# Patient Record
Sex: Male | Born: 1970
Health system: Southern US, Community
[De-identification: ages and names within clinical notes are randomized; demographics above are authoritative.]

## PROBLEM LIST (undated history)

## (undated) VITALS — BP 111/71 | HR 87 | Temp 98.6°F | Resp 16

## (undated) DIAGNOSIS — I10 Essential (primary) hypertension: Secondary | ICD-10-CM

## (undated) DIAGNOSIS — I82409 Acute embolism and thrombosis of unspecified deep veins of unspecified lower extremity: Secondary | ICD-10-CM

## (undated) DIAGNOSIS — F319 Bipolar disorder, unspecified: Secondary | ICD-10-CM

## (undated) DIAGNOSIS — I2699 Other pulmonary embolism without acute cor pulmonale: Secondary | ICD-10-CM

## (undated) DIAGNOSIS — F209 Schizophrenia, unspecified: Secondary | ICD-10-CM

## (undated) DIAGNOSIS — J449 Chronic obstructive pulmonary disease, unspecified: Secondary | ICD-10-CM

## (undated) DIAGNOSIS — F329 Major depressive disorder, single episode, unspecified: Secondary | ICD-10-CM

## (undated) DIAGNOSIS — F32A Depression, unspecified: Secondary | ICD-10-CM

## (undated) DIAGNOSIS — F419 Anxiety disorder, unspecified: Secondary | ICD-10-CM

## (undated) DIAGNOSIS — F4310 Post-traumatic stress disorder, unspecified: Secondary | ICD-10-CM

## (undated) HISTORY — PX: LEG AMPUTATION: SHX1105

## (undated) HISTORY — DX: Chronic obstructive pulmonary disease, unspecified: J44.9

## (undated) HISTORY — PX: CLAVICLE SURGERY: SHX598

## (undated) HISTORY — DX: Acute embolism and thrombosis of unspecified deep veins of unspecified lower extremity: I82.409

---
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
## 2017-12-10 ENCOUNTER — Emergency Department: Payer: Self-pay

## 2017-12-10 ENCOUNTER — Encounter: Payer: Self-pay | Admitting: Emergency Medicine

## 2017-12-10 ENCOUNTER — Other Ambulatory Visit: Payer: Self-pay

## 2017-12-10 ENCOUNTER — Emergency Department
Admission: EM | Admit: 2017-12-10 | Discharge: 2017-12-10 | Disposition: A | Discharge status: Home / Self Care | Payer: Self-pay | Attending: Emergency Medicine | Admitting: Emergency Medicine

## 2017-12-10 DIAGNOSIS — S7001XA Contusion of right hip, initial encounter: Secondary | ICD-10-CM | POA: Insufficient documentation

## 2017-12-10 DIAGNOSIS — Y999 Unspecified external cause status: Secondary | ICD-10-CM | POA: Insufficient documentation

## 2017-12-10 DIAGNOSIS — R202 Paresthesia of skin: Secondary | ICD-10-CM | POA: Insufficient documentation

## 2017-12-10 DIAGNOSIS — Y939 Activity, unspecified: Secondary | ICD-10-CM | POA: Insufficient documentation

## 2017-12-10 DIAGNOSIS — Y929 Unspecified place or not applicable: Secondary | ICD-10-CM | POA: Insufficient documentation

## 2017-12-10 DIAGNOSIS — M5416 Radiculopathy, lumbar region: Secondary | ICD-10-CM | POA: Insufficient documentation

## 2017-12-10 DIAGNOSIS — W19XXXA Unspecified fall, initial encounter: Secondary | ICD-10-CM | POA: Insufficient documentation

## 2017-12-10 DIAGNOSIS — F17210 Nicotine dependence, cigarettes, uncomplicated: Secondary | ICD-10-CM | POA: Insufficient documentation

## 2017-12-10 DIAGNOSIS — R200 Anesthesia of skin: Secondary | ICD-10-CM | POA: Insufficient documentation

## 2017-12-10 MED ORDER — HYDROCODONE-ACETAMINOPHEN 5-325 MG PO TABS: 1.0000 | ORAL_TABLET | Freq: Four times a day (QID) | ORAL | 0 refills | Status: DC | PRN

## 2017-12-10 MED ORDER — PREDNISONE 10 MG PO TABS: 10.0000 mg | ORAL_TABLET | Freq: Every day | ORAL | 0 refills | Status: DC

## 2017-12-10 MED ORDER — HYDROCODONE-ACETAMINOPHEN 5-325 MG PO TABS
1.0000 | ORAL_TABLET | ORAL | Status: AC
  Administered 2017-12-10: 1 via ORAL
  Filled 2017-12-10: qty 1

## 2017-12-10 NOTE — ED Notes (Signed)
Pt states he is here for leg pain. Pt arrives in wheelchair, appears in no acute distress.

## 2017-12-10 NOTE — Discharge Instructions (Signed)
Please take medications as prescribed.  Follow-up with orthopedics if no improvement in 1 week.  Return to the emergency department for any worsening symptoms or urgent changes in your health.

## 2017-12-10 NOTE — ED Provider Notes (Signed)
[facility] EMERGENCY DEPARTMENT Provider Note   CSN: [id number] Arrival date & time: 12/10/17  1940     History   Chief Complaint Chief Complaint  Patient presents with  . Fall    HPI Michael Farrell is a 46 y.o. male presents to the emergency department for evaluation of right hip and right lower leg pain.  Patient states he fell a couple of days ago onto his buttocks.  Was able to go to work today but noticed increased pain in his right hip, right anterior thigh and into his right lower leg.  He describes some numbness and tingling in his right anterior shin and foot.  No loss of bowel or bladder symptoms.  Pain is severe with walking.  Initially after the fall 2 days ago he was able to ambulate.  He has not been taking any medications for pain.  He describes a history of sciatica.  He denies any left leg pain.  He denies any bilateral lower extremity swelling.  No chest pain, shortness of breath.  No head injury, headache, neck pain.  HPI  History reviewed. No pertinent past medical history.  There are no active problems to display for this patient.   Past Surgical History:  Procedure Laterality Date  . CLAVICLE SURGERY     pt unsure whether it was left or right; has been broken "a couple times"       Home Medications    Prior to Admission medications   Medication Sig Start Date End Date Taking? Authorizing Provider  HYDROcodone-acetaminophen (NORCO) 5-325 MG tablet Take 1 tablet by mouth every 6 (six) hours as needed for moderate pain. 12/10/17   Evon Slack, PA-C  predniSONE (DELTASONE) 10 MG tablet Take 1 tablet (10 mg total) by mouth daily. 6,5,4,3,2,1 six day taper 12/10/17   Evon Slack, PA-C    Family History No family history on file.  Social History Social History   Tobacco Use  . Smoking status: Current Every Day Smoker    Packs/day: 1.00    Types: Cigarettes  . Smokeless tobacco: Former Engineer, water Use Topics  .  Alcohol use: Yes    Alcohol/week: 8.4 oz    Types: 14 Cans of beer per week  . Drug use: No     Allergies   Patient has no allergy information on record.   Review of Systems Review of Systems  Constitutional: Negative for fever.  Respiratory: Negative for shortness of breath.   Cardiovascular: Negative for chest pain.  Gastrointestinal: Negative for abdominal pain.  Genitourinary: Negative for difficulty urinating, dysuria and urgency.  Musculoskeletal: Positive for gait problem. Negative for back pain and myalgias.  Skin: Negative for rash.  Neurological: Positive for numbness. Negative for dizziness and headaches.     Physical Exam Updated Vital Signs BP 128/86 (BP Location: Right Arm)   Pulse (!) 123   Temp 98.4 F (36.9 C) (Oral)   Resp 18   Ht 5\' 10"  (1.778 m)   Wt 72.6 kg (160 lb)   SpO2 97%   BMI 22.96 kg/m   Physical Exam  Constitutional: He is oriented to person, place, and time. He appears well-developed and well-nourished.  HENT:  Head: Normocephalic and atraumatic.  Right Ear: External ear normal.  Left Ear: External ear normal.  Eyes: Conjunctivae and EOM are normal. Pupils are equal, round, and reactive to light.  Neck: Normal range of motion.  Cardiovascular: Normal rate.  Pulmonary/Chest: Effort normal. No respiratory  distress.  Musculoskeletal: Normal range of motion.  Lumbar Spine: Examination of the lumbar spine reveals no bony abnormality, no edema, and no ecchymosis.  There is no step off.  The patient has full range of motion of the lumbar spine with flexion and extension.  The patient has normal lateral bend and rotation.  The patient has no pain with range of motion activities. The patient is non tender along the spinous process.  The patient is non tender along the paravertebral muscles, with no muscle spasms.  The patient is non tender along the iliac crest.  The patient is non tender in the sciatic notch.  The patient is non tender along the  Sacroiliac joint.  There is no Coccyx joint tenderness.    Bilateral Lower Extremities: Examination of the lower extremities reveals no bony abnormality, no edema, and no ecchymosis.  The patient has full active and passive range of motion of the hips, knees, and ankles.  There is no discomfort with range of motion exercises.  The patient is non tender along the greater trochanter region.  The patient has a negative Denna HaggardHomans' test bilaterally.  There is normal skin warmth.  There is normal capillary refill bilaterally.  Patient is able to straight leg raise with the right lower extremity  Neurologic: The patient has a positive for right straight leg raise.  The patient has normal muscle strength testing for the quadriceps, calves, ankle dorsiflexion, ankle plantarflexion, and extensor hallicus longus.  The patient has sensation that is intact to light touch.    Neurological: He is alert and oriented to person, place, and time. No cranial nerve deficit. Coordination normal.  Skin: Skin is warm. No rash noted.  Psychiatric: He has a normal mood and affect. His behavior is normal. Thought content normal.     ED Treatments / Results  Labs (all labs ordered are listed, but only abnormal results are displayed) Labs Reviewed - No data to display  EKG  EKG Interpretation None       Radiology Dg Tibia/fibula Right  Result Date: 12/10/2017 CLINICAL DATA:  Pain after fall 2 nights ago numbness in the lower leg and foot EXAM: RIGHT TIBIA AND FIBULA - 2 VIEW COMPARISON:  None. FINDINGS: There is no evidence of fracture or other focal bone lesions. Soft tissues are unremarkable. IMPRESSION: Negative. Electronically Signed   By: Jasmine PangKim  Fujinaga M.D.   On: 12/10/2017 20:36   Dg Hip Unilat W Or Wo Pelvis 2-3 Views Right  Result Date: 12/10/2017 CLINICAL DATA:  Hip pain after fall EXAM: DG HIP (WITH OR WITHOUT PELVIS) 2-3V RIGHT COMPARISON:  None. FINDINGS: There is no evidence of hip fracture or  dislocation. There is no evidence of arthropathy or other focal bone abnormality. IMPRESSION: Negative. Electronically Signed   By: Jasmine PangKim  Fujinaga M.D.   On: 12/10/2017 20:35    Procedures Procedures (including critical care time)  Medications Ordered in ED Medications  HYDROcodone-acetaminophen (NORCO/VICODIN) 5-325 MG per tablet 1 tablet (1 tablet Oral Given 12/10/17 2105)     Initial Impression / Assessment and Plan / ED Course  I have reviewed the triage vital signs and the nursing notes.  Pertinent labs & imaging results that were available during my care of the patient were reviewed by me and considered in my medical decision making (see chart for details).     46 year old male with fall 2 days ago, initially able to ambulate but over the last 24 hours has had increased pain to the right  buttocks, right anterior thigh and lower leg.  He does describe some intermittent numbness in the right anterior shin.  X-rays of the hip, pelvis and lower leg show no evidence of acute fracture.  Symptoms consistent with lumbar radiculopathy.  He is given a prescription for prednisone 10-day taper as well as Norco to take for severe pain.  He is given crutches to help with ambulation.  He is educated on signs and symptoms to return to the ED for.  He will follow-up with orthopedics.  Final Clinical Impressions(s) / ED Diagnoses   Final diagnoses:  Fall, initial encounter  Right lumbar radiculopathy  Contusion of right hip, initial encounter    ED Discharge Orders        Ordered    predniSONE (DELTASONE) 10 MG tablet  Daily     12/10/17 2124    HYDROcodone-acetaminophen (NORCO) 5-325 MG tablet  Every 6 hours PRN     12/10/17 2124       Ronnette JuniperGaines, Shriley Joffe C, PA-C 12/10/17 2135    Governor RooksLord, Rebecca, MD 12/15/17 [phone]

## 2017-12-10 NOTE — ED Triage Notes (Addendum)
Pt arrives via POV s/p fall 2 nights ago. Pt reports falling while he was drunk two nights ago. States he is "barely" able to walk now. Pt states most of his pain is in his right groin, but right shin and ankle hurt as well. Pt reports numbness in his right lower leg and foot. Denies back and neck pain.

## 2017-12-10 NOTE — ED Notes (Signed)
Pt returned from xray

## 2020-04-30 ENCOUNTER — Emergency Department (HOSPITAL_COMMUNITY)
Admission: EM | Admit: 2020-04-30 | Discharge: 2020-05-02 | Disposition: A | Discharge status: Home / Self Care | Payer: Self-pay | Attending: Emergency Medicine | Admitting: Emergency Medicine

## 2020-04-30 ENCOUNTER — Encounter (HOSPITAL_COMMUNITY): Payer: Self-pay

## 2020-04-30 ENCOUNTER — Other Ambulatory Visit: Payer: Self-pay

## 2020-04-30 DIAGNOSIS — Z20822 Contact with and (suspected) exposure to covid-19: Secondary | ICD-10-CM | POA: Insufficient documentation

## 2020-04-30 DIAGNOSIS — F15251 Other stimulant dependence with stimulant-induced psychotic disorder with hallucinations: Secondary | ICD-10-CM | POA: Insufficient documentation

## 2020-04-30 DIAGNOSIS — F15959 Other stimulant use, unspecified with stimulant-induced psychotic disorder, unspecified: Secondary | ICD-10-CM | POA: Diagnosis present

## 2020-04-30 DIAGNOSIS — F419 Anxiety disorder, unspecified: Secondary | ICD-10-CM | POA: Insufficient documentation

## 2020-04-30 DIAGNOSIS — F329 Major depressive disorder, single episode, unspecified: Secondary | ICD-10-CM | POA: Insufficient documentation

## 2020-04-30 DIAGNOSIS — Z86711 Personal history of pulmonary embolism: Secondary | ICD-10-CM | POA: Insufficient documentation

## 2020-04-30 DIAGNOSIS — L03116 Cellulitis of left lower limb: Secondary | ICD-10-CM

## 2020-04-30 DIAGNOSIS — F1510 Other stimulant abuse, uncomplicated: Secondary | ICD-10-CM | POA: Insufficient documentation

## 2020-04-30 DIAGNOSIS — F17210 Nicotine dependence, cigarettes, uncomplicated: Secondary | ICD-10-CM | POA: Insufficient documentation

## 2020-04-30 DIAGNOSIS — Z86718 Personal history of other venous thrombosis and embolism: Secondary | ICD-10-CM | POA: Insufficient documentation

## 2020-04-30 DIAGNOSIS — R238 Other skin changes: Secondary | ICD-10-CM | POA: Insufficient documentation

## 2020-04-30 DIAGNOSIS — F1320 Sedative, hypnotic or anxiolytic dependence, uncomplicated: Secondary | ICD-10-CM | POA: Insufficient documentation

## 2020-04-30 HISTORY — DX: Anxiety disorder, unspecified: F41.9

## 2020-04-30 HISTORY — DX: Depression, unspecified: F32.A

## 2020-04-30 HISTORY — DX: Other pulmonary embolism without acute cor pulmonale: I26.99

## 2020-04-30 LAB — COMPREHENSIVE METABOLIC PANEL
ALT: 21 U/L (ref 0–44)
AST: 23 U/L (ref 15–41)
Albumin: 4 g/dL (ref 3.5–5.0)
Alkaline Phosphatase: 96 U/L (ref 38–126)
Anion gap: 10 (ref 5–15)
BUN: 13 mg/dL (ref 6–20)
CO2: 27 mmol/L (ref 22–32)
Calcium: 9.1 mg/dL (ref 8.9–10.3)
Chloride: 104 mmol/L (ref 98–111)
Creatinine, Ser: 0.68 mg/dL (ref 0.61–1.24)
GFR calc Af Amer: >60 mL/min (ref >60)
GFR calc non Af Amer: >60 mL/min (ref >60)
Glucose, Bld: 84 mg/dL (ref 70–99)
Potassium: 3.6 mmol/L (ref 3.5–5.1)
Sodium: 141 mmol/L (ref 135–145)
Total Bilirubin: 0.6 mg/dL (ref 0.3–1.2)
Total Protein: 7.3 g/dL (ref 6.5–8.1)

## 2020-04-30 LAB — CBC WITH DIFFERENTIAL/PLATELET
Abs Immature Granulocytes: 0.06 10*3/uL (ref 0.00–0.07)
Basophils Absolute: 0.1 10*3/uL (ref 0.0–0.1)
Basophils Relative: 1 %
Eosinophils Absolute: 0.3 10*3/uL (ref 0.0–0.5)
Eosinophils Relative: 2 %
HCT: 47.1 % (ref 39.0–52.0)
Hemoglobin: 15.3 g/dL (ref 13.0–17.0)
Immature Granulocytes: 0 %
Lymphocytes Relative: 21 %
Lymphs Abs: 3.6 10*3/uL (ref 0.7–4.0)
MCH: 28.4 pg (ref 26.0–34.0)
MCHC: 32.5 g/dL (ref 30.0–36.0)
MCV: 87.5 fL (ref 80.0–100.0)
Monocytes Absolute: 1.2 10*3/uL — ABNORMAL HIGH (ref 0.1–1.0)
Monocytes Relative: 7 %
Neutro Abs: 11.6 10*3/uL — ABNORMAL HIGH (ref 1.7–7.7)
Neutrophils Relative %: 69 %
Platelets: 372 10*3/uL (ref 150–400)
RBC: 5.38 MIL/uL (ref 4.22–5.81)
RDW: 14 % (ref 11.5–15.5)
WBC: 16.8 10*3/uL — ABNORMAL HIGH (ref 4.0–10.5)
nRBC: 0 % (ref 0.0–0.2)

## 2020-04-30 LAB — SARS CORONAVIRUS 2 BY RT PCR (HOSPITAL ORDER, PERFORMED IN ~~LOC~~ HOSPITAL LAB): SARS Coronavirus 2: NEGATIVE

## 2020-04-30 LAB — ACETAMINOPHEN LEVEL: Acetaminophen (Tylenol), Serum: <10 ug/mL — ABNORMAL LOW (ref 10–30)

## 2020-04-30 LAB — ETHANOL: Alcohol, Ethyl (B): <10 mg/dL (ref <10)

## 2020-04-30 LAB — SALICYLATE LEVEL: Salicylate Lvl: <7 mg/dL — ABNORMAL LOW (ref 7.0–30.0)

## 2020-04-30 MED ORDER — THIAMINE HCL 100 MG/ML IJ SOLN: 100.0000 mg | Freq: Every day | INTRAMUSCULAR | Status: DC

## 2020-04-30 MED ORDER — LORAZEPAM 2 MG/ML IJ SOLN
1.0000 mg | Freq: Once | INTRAMUSCULAR | Status: AC
  Administered 2020-04-30: 1 mg via INTRAVENOUS
  Filled 2020-04-30: qty 1

## 2020-04-30 MED ORDER — ALUM & MAG HYDROXIDE-SIMETH 200-200-20 MG/5ML PO SUSP: 30.0000 mL | Freq: Four times a day (QID) | ORAL | Status: DC | PRN

## 2020-04-30 MED ORDER — LORAZEPAM 1 MG PO TABS: 0.0000 mg | ORAL_TABLET | Freq: Four times a day (QID) | ORAL | Status: DC

## 2020-04-30 MED ORDER — THIAMINE HCL 100 MG PO TABS
100.0000 mg | ORAL_TABLET | Freq: Every day | ORAL | Status: DC
  Administered 2020-05-01 – 2020-05-02 (×2): 100 mg via ORAL
  Filled 2020-04-30 (×2): qty 1

## 2020-04-30 MED ORDER — ACETAMINOPHEN 325 MG PO TABS
650.0000 mg | ORAL_TABLET | ORAL | Status: DC | PRN
  Administered 2020-05-01: 650 mg via ORAL
  Filled 2020-04-30: qty 2

## 2020-04-30 MED ORDER — LORAZEPAM 2 MG/ML IJ SOLN: 0.0000 mg | Freq: Two times a day (BID) | INTRAMUSCULAR | Status: DC

## 2020-04-30 MED ORDER — LORAZEPAM 1 MG PO TABS: 0.0000 mg | ORAL_TABLET | Freq: Two times a day (BID) | ORAL | Status: DC

## 2020-04-30 MED ORDER — LORAZEPAM 2 MG/ML IJ SOLN: 0.0000 mg | Freq: Four times a day (QID) | INTRAMUSCULAR | Status: DC

## 2020-04-30 NOTE — BH Assessment (Signed)
Tele Assessment Note   Patient Name: Michael Farrell MRN: [id number] Referring Physician: Harlene Salts, PA Location of Patient: [facility] Location of Provider: Behavioral Health TTS Department  Michael Farrell is an 49 y.o. male presented to the ED with complaint of auditory/visual hallucinations ongoing for 1 week. Per ED note, patient was brought in by girlfriend of 3-4 months, stated she has never seen patient like this before. Patient reported daily usage of "ICE" drug, patient unable to speak of amounts used daily. Patient admitted to auditory and visual hallucinations, stating "I see plants talking, clowns talking and little people, they are all trying to kill me". Patient reported increased depressive symptoms including, feelings of hopelessness and guilt, wanting to be alone, crying spells, increased anxiety, irritability/anger and lost of interest. Patient reported 4 hours sleep at night and fair appetite.   Patient denied history of mental health outpatient or inpatient treatment and is currently not receiving any services. Patient denied prior suicide attempts and self-harming behaviors. Patient denied SI and HI. Patient was drowsy and falling asleep during assessment.   Diagnosis: Stimulant use disorder  Past Medical History:  Past Medical History:  Diagnosis Date  . Anxiety   . Depression   . Pulmonary embolus [facility])     Past Surgical History:  Procedure Laterality Date  . CLAVICLE SURGERY     pt unsure whether it was left or right; has been broken "a couple times"  . LEG AMPUTATION      Family History:  Family History  Family history unknown: Yes    Social History:  reports that he has been smoking cigarettes. He has been smoking about 1.00 pack per day. He has quit using smokeless tobacco. He reports previous alcohol use. He reports current drug use. Drug: Marijuana.  Additional Social History:  Alcohol / Drug Use Pain Medications: see MAR Prescriptions: see MAR Over  the Counter: see MAR  CIWA: CIWA-Ar BP: (!) 89/59 Pulse Rate: (!) 104 COWS:    Allergies: No Known Allergies  Home Medications: (Not in a hospital admission)   OB/GYN Status:  No LMP for male patient.  General Assessment Data Location of Assessment: [facility] TTS Assessment: In system Is this a Tele or Face-to-Face Assessment?: Tele Assessment Is this an Initial Assessment or a Re-assessment for this encounter?: Initial Assessment Patient Accompanied by:: N/A Language Other than English: No Living Arrangements: (home with fiance) What gender do you identify as?: Male Marital status: Single Pregnancy Status: No Living Arrangements: Spouse/significant other Can pt return to current living arrangement?: Yes Admission Status: Voluntary Is patient capable of signing voluntary admission?: Yes Referral Source: Self/Family/Friend     Crisis Care Plan Living Arrangements: Spouse/significant other Legal Guardian: (self) Name of Psychiatrist: (none) Name of Therapist: (none)  Education Status Is patient currently in school?: No Is the patient employed, unemployed or receiving disability?: Unemployed  Risk to self with the past 6 months Suicidal Ideation: No Has patient been a risk to self within the past 6 months prior to admission? : No Suicidal Intent: No Has patient had any suicidal intent within the past 6 months prior to admission? : No Is patient at risk for suicide?: No Suicidal Plan?: No Has patient had any suicidal plan within the past 6 months prior to admission? : No Access to Means: No What has been your use of drugs/alcohol within the last 12 months?: ("ICE") Previous Attempts/Gestures: No How many times?: (0) Other Self Harm Risks: (none) Triggers for Past Attempts: (n/a) Intentional Self  Injurious Behavior: None Family Suicide History: No Recent stressful life event(s): (hallucinations) Persecutory voices/beliefs?: No Depression: Yes Depression Symptoms:  Insomnia, Tearfulness, Isolating, Fatigue, Guilt, Loss of interest in usual pleasures, Feeling worthless/self pity, Feeling angry/irritable Substance abuse history and/or treatment for substance abuse?: No Suicide prevention information given to non-admitted patients: Not applicable  Risk to Others within the past 6 months Homicidal Ideation: No Does patient have any lifetime risk of violence toward others beyond the six months prior to admission? : No Thoughts of Harm to Others: No Current Homicidal Intent: No Current Homicidal Plan: No Access to Homicidal Means: No Identified Victim: (n/a) History of harm to others?: No Assessment of Violence: None Noted Violent Behavior Description: (none reported) Does patient have access to weapons?: No Criminal Charges Pending?: No Does patient have a court date: No Is patient on probation?: No  Psychosis Hallucinations: None noted Delusions: None noted  Mental Status Report Appearance/Hygiene: Unremarkable Eye Contact: Poor Motor Activity: Restlessness Speech: Slurred, Slow Level of Consciousness: Sleeping, Drowsy Mood: Depressed Affect: Depressed, Appropriate to circumstance Anxiety Level: Minimal Thought Processes: Unable to Assess Judgement: Impaired Orientation: Unable to assess Obsessive Compulsive Thoughts/Behaviors: None  Cognitive Functioning Concentration: Poor Memory: Recent Impaired, Remote Impaired Is patient IDD: No Insight: Poor Impulse Control: Poor Appetite: Fair Sleep: No Change Total Hours of Sleep: (4) Vegetative Symptoms: Staying in bed, Decreased grooming  ADLScreening [facility] Assessment Services) Patient's cognitive ability adequate to safely complete daily activities?: Yes Patient able to express need for assistance with ADLs?: Yes Independently performs ADLs?: Yes (appropriate for developmental age)  Prior Inpatient Therapy Prior Inpatient Therapy: No  Prior Outpatient Therapy Prior Outpatient  Therapy: No Does patient have an ACCT team?: No Does patient have Intensive In-House Services?  : No Does patient have [facility] services? : No Does patient have P4CC services?: No  ADL Screening (condition at time of admission) Patient's cognitive ability adequate to safely complete daily activities?: Yes Patient able to express need for assistance with ADLs?: Yes Independently performs ADLs?: Yes (appropriate for developmental age) Regulatory affairs officer (For Healthcare) Does Patient Have a Medical Advance Directive?: No Would patient like information on creating a medical advance directive?: Yes (ED - Information included in AVS)  Disposition:  Disposition Initial Assessment Completed for this Encounter: Yes  Adaku Anike, NP, recommends overnight observation for safety and stabilization with psych reassessment in the AM.  This service was provided via telemedicine using a 2-way, interactive audio and video technology.  Names of all persons participating in this telemedicine service and their role in this encounter. Name: Danish Ruffins Role: Patient  Name: Kirtland Bouchard Role: TTS Clinician  Name:  Role:   Name: Role:     Venora Maples 04/30/2020 9:56 PM

## 2020-04-30 NOTE — ED Provider Notes (Addendum)
[facility]-EMERGENCY DEPT Provider Note   CSN: [id number] Arrival date & time: 04/30/20  1228     History Chief Complaint  Patient presents with  . Hallucinations    Michael Farrell is a 49 y.o. male history leg amputation, PE, anxiety, depression, polysubstance abuse.  Patient presents today for visual and auditory hallucinations ongoing for 1 week.  He is accompanied by his girlfriend, they have been together for around 3-4 months.  Patient's only other has never seen him like this before.  Patient reports that he is seeing and hearing people talk around him and would not disclose what they are speaking about.  He is looking around the room wildly during our discussion.  He reports that he recently used "ice" he believes this is related to methamphetamine.  He denies any pain at this time, reports that his overall feeling well.  I advised patient that he was tachycardic he reports that he "always has a fast heart rate".  He denies using any other drugs besides ice and denies any alcohol use.  Level 5 caveat psychiatric disorder  HPI     Past Medical History:  Diagnosis Date  . Anxiety   . Depression   . Pulmonary embolus (HCC)     There are no problems to display for this patient.   Past Surgical History:  Procedure Laterality Date  . CLAVICLE SURGERY     pt unsure whether it was left or right; has been broken "a couple times"  . LEG AMPUTATION         Family History  Family history unknown: Yes    Social History   Tobacco Use  . Smoking status: Current Every Day Smoker    Packs/day: 1.00    Types: Cigarettes  . Smokeless tobacco: Former Engineer, water Use Topics  . Alcohol use: Not Currently  . Drug use: Yes    Types: Marijuana    Home Medications Prior to Admission medications   Medication Sig Start Date End Date Taking? Authorizing Provider  HYDROcodone-acetaminophen (NORCO) 5-325 MG tablet Take 1 tablet by mouth every 6 (six)  hours as needed for moderate pain. 12/10/17   Evon Slack, PA-C  predniSONE (DELTASONE) 10 MG tablet Take 1 tablet (10 mg total) by mouth daily. 6,5,4,3,2,1 six day taper 12/10/17   Evon Slack, PA-C    Allergies    Patient has no known allergies.  Review of Systems   Review of Systems  Unable to perform ROS: Psychiatric disorder    Physical Exam Updated Vital Signs BP 112/76 (BP Location: Right Arm)   Pulse (!) 136   Temp 98.9 F (37.2 C) (Oral)   Resp 18   Ht 6\' 2"  (1.88 m)   Wt 110.7 kg   SpO2 97%   BMI 31.33 kg/m   Physical Exam Constitutional:      General: He is not in acute distress.    Appearance: Normal appearance. He is well-developed. He is not ill-appearing or diaphoretic.  HENT:     Head: Normocephalic and atraumatic.     Right Ear: External ear normal.     Left Ear: External ear normal.     Nose: Nose normal.  Eyes:     General: Vision grossly intact. Gaze aligned appropriately.     Pupils: Pupils are equal, round, and reactive to light.  Neck:     Trachea: Trachea and phonation normal. No tracheal deviation.  Pulmonary:     Effort: Pulmonary effort  is normal. No respiratory distress.  Abdominal:     General: There is no distension.     Palpations: Abdomen is soft.     Tenderness: There is no abdominal tenderness. There is no guarding or rebound.  Musculoskeletal:        General: Normal range of motion.     Cervical back: Normal range of motion.     Comments: No midline C/T/L spinal tenderness to palpation, no deformity, crepitus, or step-off noted. No sign of injury to the neck or back.      Right Lower Extremity: Right leg is amputated above knee.  Skin:    General: Skin is warm and dry.  Neurological:     Mental Status: He is alert.     GCS: GCS eye subscore is 4. GCS verbal subscore is 5. GCS motor subscore is 6.     Comments: Speech is clear and goal oriented, follows commands Major Cranial nerves without deficit, no facial  droop Moves extremities without ataxia, coordination intact  Psychiatric:        Attention and Perception: He perceives auditory and visual hallucinations.        Mood and Affect: Mood is elated.        Speech: Speech is delayed.        Behavior: Behavior is hyperactive.        Thought Content: Thought content does not include homicidal or suicidal ideation.     ED Results / Procedures / Treatments   Labs (all labs ordered are listed, but only abnormal results are displayed) Labs Reviewed  SARS CORONAVIRUS 2 BY RT PCR (HOSPITAL ORDER, PERFORMED IN [facility])  COMPREHENSIVE METABOLIC PANEL  ETHANOL  RAPID URINE DRUG SCREEN, HOSP PERFORMED  CBC WITH DIFFERENTIAL/PLATELET  SALICYLATE LEVEL  ACETAMINOPHEN LEVEL    EKG None  Radiology No results found.  Procedures Procedures (including critical care time)  Medications Ordered in ED Medications  LORazepam (ATIVAN) injection 1 mg (has no administration in time range)    ED Course  I have reviewed the triage vital signs and the nursing notes.  Pertinent labs & imaging results that were available during my care of the patient were reviewed by me and considered in my medical decision making (see chart for details).    MDM Rules/Calculators/A&P                     Additional History Obtained: 1. Nursing notes from this visit. 2. Prior ED visit on December 10, 2017 reviewed patient noted fall and was having radicular symptoms, not pertinent to this visit. 3. No other visits available through EMR, additionally no Care Everywhere button. 4. Patient's girlfriend at bedside.  49 year old male arrives tachycardic, endorsing hallucinations after using ice which appears to be a type of methamphetamine.  He denies any pain, injury or other ingestions today.  He is alert, distracted but answers questions appropriately.  Suspect tachycardia is secondary to methamphetamine use.  Discussed case with Dr. Clarice Pole, will give  Ativan, obtain medical clearance labs and reassess.  Patient currently denies SI or HI. ---------------------- Patient was reassessed he is sleeping comfortably no acute distress tachycardia improved to around 110 bpm.  CBC has resulted with leukocytosis of 16.8 with left shift nonspecific at this time as patient has no infectious symptoms.  No evidence of anemia.  Remainder of blood work is pending.  Care handoff given to [facility] PA-C at shift change.  Plan of care is to  follow-up on remaining medical clearance labs and reassess patient likely will need TTS.  Final disposition per oncoming team.     Note: Portions of this report may have been transcribed using voice recognition software. Every effort was made to ensure accuracy; however, inadvertent computerized transcription errors may still be present. Final Clinical Impression(s) / ED Diagnoses Final diagnoses:  None    Rx / DC Orders ED Discharge Orders    None       Gari Crown 04/30/20 [id number] 04/30/20 1515    Charlesetta Shanks, MD 05/22/20 1357

## 2020-04-30 NOTE — ED Notes (Signed)
All notes prior to 1305 are charted on the wrong person

## 2020-04-30 NOTE — ED Provider Notes (Signed)
Care assumed from [facility] at shift change pending medical clearance. See his note for full HPI.  In short, patient is a 49 year old male with a past medical history of PE, anxiety, depression, polysubstance abuse who presents to the ED due to visual and auditory illusions for the past week.  Patient is accompanied by his girlfriend.  Patient notes he has been seeing and hearing people talk around him.  Very difficult to obtain HPI.  Patient notes he "always has a fast heart rate" when asked about his elevated heart rate.  Patient notes he uses "ice" frequently which he related to methamphetamine. He denies other drug use and alcohol use.   Patient denies suicidal ideations and homicidal ideations.   Physical Exam  BP 118/76   Pulse (!) 112   Temp 98.9 F (37.2 C) (Oral)   Resp 18   Ht 6\' 2"  (1.88 m)   Wt 110.7 kg   SpO2 96%   BMI 31.33 kg/m   Physical Exam Vitals and nursing note reviewed.  Constitutional:      General: He is not in acute distress.    Appearance: He is not ill-appearing.  HENT:     Head: Normocephalic.  Eyes:     Pupils: Pupils are equal, round, and reactive to light.  Cardiovascular:     Rate and Rhythm: Regular rhythm. Tachycardia present.     Pulses: Normal pulses.     Heart sounds: Normal heart sounds. No murmur. No friction rub. No gallop.   Pulmonary:     Effort: Pulmonary effort is normal.     Breath sounds: Normal breath sounds.  Abdominal:     General: Abdomen is flat. There is no distension.     Palpations: Abdomen is soft.     Tenderness: There is no abdominal tenderness. There is no guarding or rebound.  Musculoskeletal:     Cervical back: Neck supple.  Neurological:     General: No focal deficit present.     Mental Status: He is alert.  Psychiatric:        Attention and Perception: He perceives auditory and visual hallucinations.     ED Course/Procedures   Clinical Course as of May 01 1651  Tue Apr 30, 2020  1552 WBC(!): 16.8  [CA]    Clinical Course User Index [CA] May 02, 2020    Procedures  MDM  Care assumed from [facility], [facility] at shift change. See his note for full MDM.   49 year old male presents to the ED due to visual and auditory hallucinations that been ongoing for the past week.  Patient is here voluntarily. Patient has a history of polysubstance abuse, anxiety, and depression.  Upon arrival, patient is tachycardic which previous provider believed was secondary to methamphetamine use.  Patient denies alcohol use.  Patient given Ativan earlier during his ED stay.   CBC significant for mild leukocytosis at 16.8 likely due to stress reaction.  CMP unremarkable with normal renal function and no electrolyte derangements.  Normal ethanol, salicylate, and acetaminophen level.  Covid test negative.  4:58 PM Reassessed patient at bedside.  Patient resting comfortably in bed. Patient denies shortness of breath and chest pain.  No symptoms of pulmonary embolism.  Patient's tachycardia has slightly improved.  During my evaluation his heart rate was between 103-107.   Patient has been medically cleared for TTS evaluation.  Discussed case with Dr. 52 who agrees with assessment and plan.   Reviewed TTS note who recommends overnight observation  and reassessment in the AM.    The patient has been placed in psychiatric observation due to the need to provide a safe environment for the patient while obtaining psychiatric consultation and evaluation, as well as ongoing medical and medication management to treat the patient's condition.  The patient has not been placed under full IVC at this time.     Karie Kirks 04/30/20 2208    Lacretia Leigh, MD 04/30/20 [phone]

## 2020-04-30 NOTE — ED Notes (Signed)
TTS assessment completed.  Adaku Anike, NP, recommends overnight observation for safety and stabilization with psych reassessment in the AM.  

## 2020-04-30 NOTE — ED Triage Notes (Addendum)
Patient c/o visual and auditory hallucinations x 1 week  Patient denies any SI/HI.  Patient states he did not want to talk about his medical history when triaging the patient.

## 2020-05-01 DIAGNOSIS — F1510 Other stimulant abuse, uncomplicated: Secondary | ICD-10-CM | POA: Diagnosis present

## 2020-05-01 DIAGNOSIS — F15959 Other stimulant use, unspecified with stimulant-induced psychotic disorder, unspecified: Secondary | ICD-10-CM | POA: Diagnosis present

## 2020-05-01 DIAGNOSIS — F1320 Sedative, hypnotic or anxiolytic dependence, uncomplicated: Secondary | ICD-10-CM | POA: Diagnosis present

## 2020-05-01 LAB — RAPID URINE DRUG SCREEN, HOSP PERFORMED
Amphetamines: POSITIVE — AB
Barbiturates: NOT DETECTED
Benzodiazepines: POSITIVE — AB
Cocaine: NOT DETECTED
Opiates: NOT DETECTED
Tetrahydrocannabinol: NOT DETECTED

## 2020-05-01 MED ORDER — HYDROXYZINE HCL 25 MG PO TABS: 25.0000 mg | ORAL_TABLET | Freq: Four times a day (QID) | ORAL | Status: DC | PRN

## 2020-05-01 MED ORDER — GABAPENTIN 400 MG PO CAPS
400.0000 mg | ORAL_CAPSULE | Freq: Three times a day (TID) | ORAL | Status: DC
  Administered 2020-05-01 – 2020-05-02 (×2): 400 mg via ORAL
  Filled 2020-05-01 (×2): qty 1

## 2020-05-01 MED ORDER — LOPERAMIDE HCL 2 MG PO CAPS: 2.0000 mg | ORAL_CAPSULE | ORAL | Status: DC | PRN

## 2020-05-01 MED ORDER — ONDANSETRON 4 MG PO TBDP: 4.0000 mg | ORAL_TABLET | Freq: Four times a day (QID) | ORAL | Status: DC | PRN

## 2020-05-01 MED ORDER — CHLORDIAZEPOXIDE HCL 25 MG PO CAPS: 25.0000 mg | ORAL_CAPSULE | Freq: Four times a day (QID) | ORAL | Status: DC | PRN

## 2020-05-01 MED ORDER — OLANZAPINE 5 MG PO TABS
5.0000 mg | ORAL_TABLET | Freq: Two times a day (BID) | ORAL | Status: DC
  Administered 2020-05-01 – 2020-05-02 (×3): 5 mg via ORAL
  Filled 2020-05-01 (×3): qty 1

## 2020-05-01 MED ORDER — ESCITALOPRAM OXALATE 10 MG PO TABS
10.0000 mg | ORAL_TABLET | Freq: Every day | ORAL | Status: DC
  Administered 2020-05-02: 10 mg via ORAL
  Filled 2020-05-01: qty 1

## 2020-05-01 MED ORDER — METOPROLOL TARTRATE 25 MG PO TABS
25.0000 mg | ORAL_TABLET | Freq: Two times a day (BID) | ORAL | Status: DC
  Administered 2020-05-01 – 2020-05-02 (×2): 25 mg via ORAL
  Filled 2020-05-01 (×2): qty 1

## 2020-05-01 MED ORDER — ADULT MULTIVITAMIN W/MINERALS CH
1.0000 | ORAL_TABLET | Freq: Every day | ORAL | Status: DC
  Administered 2020-05-01 – 2020-05-02 (×2): 1 via ORAL
  Filled 2020-05-01 (×2): qty 1

## 2020-05-01 NOTE — ED Notes (Signed)
ED brought patient to TCU for overnight observation and to be reevaluated in am. He presented with complaint of A V hallucinations. He states he also used ICE. Denies hallucinations when not using ICE. Waiting on a urine sample for the drug screen. Gave him two cups of OJ on arrival to help with urine production and a sample. 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 Urinal at his bedside. He denies thoughts to hurt self or others. Cooperative.

## 2020-05-01 NOTE — BH Assessment (Signed)
BHH Assessment Progress Note  Per Shuvon Rankin, FNP, pt is to be observed overnight.  Jasmine reports that no observation beds are currently available.  Pt's nurse, Kendal Hymen, has been notified.  Doylene Canning, [place] Behavioral Health Coordinator [phone]

## 2020-05-01 NOTE — ED Notes (Signed)
Woke him for CIWA and vitals. His only complaint on awakening is pain in his R leg. States its constant and he has it every day. States pain is a 10. He reports he only takes Tylenol for it and it is a prn for him so it was given. Also, gave him a hot pack for his complaint of pain. CIWA 0. Reminded still need a urine specimen. Cup and urinal on his bedside table.

## 2020-05-01 NOTE — Progress Notes (Signed)
Received Michael Farrell at the change of shift asleep in his room. He was awaken for VS check and received his HS medications. He ate his dinner from earlier. He slept throughout the night without incident.

## 2020-05-01 NOTE — ED Notes (Signed)
Urine sample provided.

## 2020-05-01 NOTE — H&P (Signed)
BH Observation Unit Provider Admission PAA/H&P  Patient Identification: Michael Farrell MRN:  [id number] Date of Evaluation:  05/01/2020 Chief Complaint:  Hallucinations Principal Diagnosis: Amphetamine-induced psychotic disorder (HCC) Diagnosis:  Principal Problem:   Amphetamine-induced psychotic disorder (HCC) Active Problems:   Amphetamine abuse (HCC)   Moderate benzodiazepine use disorder (HCC)  History of Present Illness: Michael Farrell, 49 y.o., male patient seen via tele psych by this provider, Dr. Lucianne MussKumar; and chart reviewed on 05/01/20.  On evaluation Michael Farrell reports he came to the hospital because of paranoia and hearing voices.  Patient states that he lives with his girlfriend who is supportive and unemployed related to disability.  Patient states that he has been smoking ICE for years "but I ain't never heard no voices or got paranoid.  No I don't want to stop.  I love it. Just need to stop hearing these voices."  Patient informed that it would be beneficial to stop use of ICE that it could continue to cause auditory hallucinations and worsens with each use.  Understanding voice.  Patient states he has never been on psychotropic medications.  Discussed starting Zyprexa to help with hallucinations and paranoia and reassess tomorrow.   During evaluation Michael Farrell is alert/oriented x 4; calm/cooperative; and mood is congruent with affect.  He is endorsing auditory hallucinations.  He doesn't appear to have any delusional thoughts.  Patient denies suicidal/self-harm/homicidal ideation.  Patient answered question appropriately.     Associated Signs/Symptoms: Depression Symptoms:  depressed mood, (Hypo) Manic Symptoms:  Hallucinations, Impulsivity, Labiality of Mood, Anxiety Symptoms:  Excessive Worry, Psychotic Symptoms:  Hallucinations: Auditory Paranoia, PTSD Symptoms: NA Total Time spent with patient: 30 minutes  Past Psychiatric History: Polysubstance abuse  Is the  patient at risk to self? No.  Has the patient been a risk to self in the past 6 months? No.  Has the patient been a risk to self within the distant past? No.  Is the patient a risk to others? No.  Has the patient been a risk to others in the past 6 months? No.  Has the patient been a risk to others within the distant past? No.   Prior Inpatient Therapy: Prior Inpatient Therapy: No Prior Outpatient Therapy: Prior Outpatient Therapy: No Does patient have an ACCT team?: No Does patient have Intensive In-House Services?  : No Does patient have [facility] services? : No Does patient have P4CC services?: No  Alcohol Screening:   Substance Abuse History in the last 12 months:  Yes.   Consequences of Substance Abuse: hallucinations and paranoia Previous Psychotropic Medications: No  Psychological Evaluations: No  Past Medical History:  Past Medical History:  Diagnosis Date  . Anxiety   . Depression   . Pulmonary embolus [facility](HCC)     Past Surgical History:  Procedure Laterality Date  . CLAVICLE SURGERY     pt unsure whether it was left or right; has been broken "a couple times"  . LEG AMPUTATION     Family History:  Family History  Family history unknown: Yes   Family Psychiatric History: Denies  Tobacco Screening:   Social History:  Social History   Substance and Sexual Activity  Alcohol Use Not Currently     Social History   Substance and Sexual Activity  Drug Use Yes  . Types: Marijuana    Additional Social History: Marital status: Single    Pain Medications: see MAR Prescriptions: see MAR Over the Counter: see MAR  Allergies:  No Known Allergies Lab Results:  Results for orders placed or performed during the hospital encounter of 04/30/20 (from the past 48 hour(s))  Comprehensive metabolic panel     Status: None   Collection Time: 04/30/20  2:27 PM  Result Value Ref Range   Sodium 141 135 - 145 mmol/L   Potassium 3.6 3.5 - 5.1 mmol/L    Chloride 104 98 - 111 mmol/L   CO2 27 22 - 32 mmol/L   Glucose, Bld 84 70 - 99 mg/dL    Comment: Glucose reference range applies only to samples taken after fasting for at least 8 hours.   BUN 13 6 - 20 mg/dL   Creatinine, Ser 9.45 0.61 - 1.24 mg/dL   Calcium 9.1 8.9 - 85.9 mg/dL   Total Protein 7.3 6.5 - 8.1 g/dL   Albumin 4.0 3.5 - 5.0 g/dL   AST 23 15 - 41 U/L   ALT 21 0 - 44 U/L   Alkaline Phosphatase 96 38 - 126 U/L   Total Bilirubin 0.6 0.3 - 1.2 mg/dL   GFR calc non Af Amer >60 >60 mL/min   GFR calc Af Amer >60 >60 mL/min   Anion gap 10 5 - 15    Comment: Performed at [facility], [address], [place], [place] [address]  Ethanol     Status: None   Collection Time: 04/30/20  2:27 PM  Result Value Ref Range   Alcohol, Ethyl (B) <10 <10 mg/dL    Comment: (NOTE) Lowest detectable limit for serum alcohol is 10 mg/dL. For medical purposes only. Performed at [facility], [address] [place], [place] [address]   CBC with Diff     Status: Abnormal   Collection Time: 04/30/20  2:27 PM  Result Value Ref Range   WBC 16.8 (H) 4.0 - 10.5 K/uL   RBC 5.38 4.22 - 5.81 MIL/uL   Hemoglobin 15.3 13.0 - 17.0 g/dL   HCT 81.7 71.1 - 65.7 %   MCV 87.5 80.0 - 100.0 fL   MCH 28.4 26.0 - 34.0 pg   MCHC 32.5 30.0 - 36.0 g/dL   RDW 90.3 83.3 - 38.3 %   Platelets 372 150 - 400 K/uL   nRBC 0.0 0.0 - 0.2 %   Neutrophils Relative % 69 %   Neutro Abs 11.6 (H) 1.7 - 7.7 K/uL   Lymphocytes Relative 21 %   Lymphs Abs 3.6 0.7 - 4.0 K/uL   Monocytes Relative 7 %   Monocytes Absolute 1.2 (H) 0.1 - 1.0 K/uL   Eosinophils Relative 2 %   Eosinophils Absolute 0.3 0.0 - 0.5 K/uL   Basophils Relative 1 %   Basophils Absolute 0.1 0.0 - 0.1 K/uL   Immature Granulocytes 0 %   Abs Immature Granulocytes 0.06 0.00 - 0.07 K/uL    Comment: Performed at [facility], [address], [place], [place] [address]  Salicylate level     Status:  Abnormal   Collection Time: 04/30/20  2:27 PM  Result Value Ref Range   Salicylate Lvl <7.0 (L) 7.0 - 30.0 mg/dL    Comment: Performed at [facility], [address] [place], [place] [address]  Acetaminophen level     Status: Abnormal   Collection Time: 04/30/20  2:27 PM  Result Value Ref Range   Acetaminophen (Tylenol), Serum <10 (L) 10 - 30 ug/mL    Comment: (NOTE) Therapeutic concentrations vary significantly. A range of  10-30 ug/mL  may be an effective concentration for many patients. However, some  are best treated at concentrations outside of this range. Acetaminophen concentrations >150 ug/mL at 4 hours after ingestion  and >50 ug/mL at 12 hours after ingestion are often associated with  toxic reactions. Performed at [facility], [place] [address] [place], [place] [address]   SARS Coronavirus 2 by RT PCR (hospital order, performed in [facility] hospital lab) Nasopharyngeal Nasopharyngeal Swab     Status: None   Collection Time: 04/30/20  2:30 PM   Specimen: Nasopharyngeal Swab  Result Value Ref Range   SARS Coronavirus 2 NEGATIVE NEGATIVE    Comment: (NOTE) SARS-CoV-2 target nucleic acids are NOT DETECTED. The SARS-CoV-2 RNA is generally detectable in upper and lower respiratory specimens during the acute phase of infection. The lowest concentration of SARS-CoV-2 viral copies this assay can detect is 250 copies / mL. A negative result does not preclude SARS-CoV-2 infection and should not be used as the sole basis for treatment or other patient management decisions.  A negative result may occur with improper specimen collection / handling, submission of specimen other than nasopharyngeal swab, presence of viral mutation(s) within the areas targeted by this assay, and inadequate number of viral copies (<250 copies / mL). A negative result must be combined with clinical observations, patient history, and epidemiological  information. Fact Sheet for Patients:   StrictlyIdeas.no Fact Sheet for Healthcare Providers: BankingDealers.co.za This test is not yet approved or cleared  by the [place] FDA and has been authorized for detection and/or diagnosis of SARS-CoV-2 by FDA under an Emergency Use Authorization (EUA).  This EUA will remain in effect (meaning this test can be used) for the duration of the COVID-19 declaration under Section 564(b)(1) of the Act, 21 U.S.C. section 360bbb-3(b)(1), unless the authorization is terminated or revoked sooner. Performed at [facility], [place] [address] [place], [place] [address]   Urine rapid drug screen (hosp performed)     Status: Abnormal   Collection Time: 05/01/20  6:18 AM  Result Value Ref Range   Opiates NONE DETECTED NONE DETECTED   Cocaine NONE DETECTED NONE DETECTED   Benzodiazepines POSITIVE (A) NONE DETECTED   Amphetamines POSITIVE (A) NONE DETECTED   Tetrahydrocannabinol NONE DETECTED NONE DETECTED   Barbiturates NONE DETECTED NONE DETECTED    Comment: (NOTE) DRUG SCREEN FOR MEDICAL PURPOSES ONLY.  IF CONFIRMATION IS NEEDED FOR ANY PURPOSE, NOTIFY LAB WITHIN 5 DAYS. LOWEST DETECTABLE LIMITS FOR URINE DRUG SCREEN Drug Class                     Cutoff (ng/mL) Amphetamine and metabolites    1000 Barbiturate and metabolites    200 Benzodiazepine                 397 Tricyclics and metabolites     300 Opiates and metabolites        300 Cocaine and metabolites        300 THC                            50 Performed at [facility], [place] [address] [place], [place] [address]     Blood Alcohol level:  Lab Results  Component Value Date   ETH <10 93/79/0240    Metabolic Disorder Labs:  No results found for: HGBA1C, MPG No results found for: PROLACTIN No results found  for: CHOL, TRIG, HDL, CHOLHDL, VLDL, LDLCALC  Current Medications: Current  Facility-Administered Medications  Medication Dose Route Frequency Provider Last Rate Last Admin  . acetaminophen (TYLENOL) tablet 650 mg  650 mg Oral Q4H PRN [name], PA-C   650 mg at 05/01/20 0500  . alum & mag hydroxide-simeth (MAALOX/MYLANTA) 200-200-20 MG/5ML suspension 30 mL  30 mL Oral Q6H PRN Aberman, Caroline C, PA-C      . chlordiazePOXIDE (LIBRIUM) capsule 25 mg  25 mg Oral Q6H PRN Luann Aspinwall B, NP      . hydrOXYzine (ATARAX/VISTARIL) tablet 25 mg  25 mg Oral Q6H PRN Bryna Razavi B, NP      . loperamide (IMODIUM) capsule 2-4 mg  2-4 mg Oral PRN Nkechi Linehan B, NP      . multivitamin with minerals tablet 1 tablet  1 tablet Oral Daily Cambree Hendrix B, NP      . OLANZapine (ZYPREXA) tablet 5 mg  5 mg Oral BID Crystallee Werden B, NP      . ondansetron (ZOFRAN-ODT) disintegrating tablet 4 mg  4 mg Oral Q6H PRN Imanie Darrow B, NP      . thiamine tablet 100 mg  100 mg Oral Daily Claudette [place] C, PA-C   100 mg at 05/01/20 1000   Or  . thiamine (B-1) injection 100 mg  100 mg Intravenous Daily [name], PA-C       Current Outpatient Medications  Medication Sig Dispense Refill  . apixaban (ELIQUIS) 5 MG TABS tablet Take 5 mg by mouth 2 (two) times daily.    Marland Kitchen aspirin EC 81 MG tablet Take 81 mg by mouth daily.    . cyclobenzaprine (FLEXERIL) 10 MG tablet Take 10 mg by mouth 3 (three) times daily as needed for muscle spasms.    Marland Kitchen escitalopram (LEXAPRO) 10 MG tablet Take 10 mg by mouth daily.    Marland Kitchen gabapentin (NEURONTIN) 400 MG capsule Take 400 mg by mouth 3 (three) times daily.    . metoprolol tartrate (LOPRESSOR) 25 MG tablet Take 25 mg by mouth 2 (two) times daily.    Marland Kitchen HYDROcodone-acetaminophen (NORCO) 5-325 MG tablet Take 1 tablet by mouth every 6 (six) hours as needed for moderate pain. (Patient not taking: Reported on 05/01/2020) 15 tablet 0  . predniSONE (DELTASONE) 10 MG tablet Take 1 tablet (10 mg total) by mouth daily. 6,5,4,3,2,1 six day taper  (Patient not taking: Reported on 05/01/2020) 21 tablet 0   PTA Medications: (Not in a hospital admission)   Musculoskeletal: Strength & Muscle Tone: within normal limits Gait & Station: normal Patient leans: N/A  Psychiatric Specialty Exam: Physical Exam  Nursing note and vitals reviewed. Constitutional: He is oriented to person, place, and time.  Respiratory: Effort normal.  Musculoskeletal:     Cervical back: Normal range of motion.  Neurological: He is alert and oriented to person, place, and time.  Psychiatric: His speech is normal. His mood appears anxious. He is actively hallucinating. Thought content is paranoid. Thought content is not delusional. Cognition and memory are normal. He expresses impulsivity. He exhibits a depressed mood. He expresses no homicidal and no suicidal ideation.    Review of Systems  Psychiatric/Behavioral: Positive for hallucinations. Negative for self-injury and suicidal ideas.       Patient reporting chronic history of meth use; but this is the first time he has ever had hallucinations and paranoia because of it.  States daily use  All other systems reviewed and are negative.  Blood pressure 93/66, pulse (!) 102, temperature 98.6 F (37 C), temperature source Oral, resp. rate 16, height 6\' 2"  (1.88 m), weight 110.7 kg, SpO2 95 %.Body mass index is 31.33 kg/m.  General Appearance: Casual  Eye Contact:  Good  Speech:  Clear and Coherent and Normal Rate  Volume:  Normal  Mood:  Anxious  Affect:  Depressed  Thought Process:  Coherent, Goal Directed and Descriptions of Associations: Intact  Orientation:  Full (Time, Place, and Person)  Thought Content:  Hallucinations: Auditory and Paranoid Ideation  Suicidal Thoughts:  No  Homicidal Thoughts:  No  Memory:  Immediate;   Good Recent;   Good  Judgement:  Fair  Insight:  Present  Psychomotor Activity:  Normal  Concentration:  Concentration: Fair and Attention Span: Fair  Recall:  Good  Fund of  Knowledge:  Good  Language:  Good  Akathisia:  No  Handed:  Right  AIMS (if indicated):     Assets:  Communication Skills Desire for Improvement Intimacy Leisure Time Social Support  ADL's:  Intact  Cognition:  WNL  Sleep:         Treatment Plan Summary: Medication management and Plan Observation overnight  Observation Level/Precautions:  15 minute checks Laboratory:  CBC Chemistry Profile UDS UA Psychotherapy:  Individual Medications:  Started Zyprexa Consultations:  As needed Discharge Concerns:  Safety Estimated LOS:  Overnight observation Other:      Arta Stump, NP 5/19/20212:31 PM

## 2020-05-02 ENCOUNTER — Emergency Department (HOSPITAL_BASED_OUTPATIENT_CLINIC_OR_DEPARTMENT_OTHER): Payer: Self-pay

## 2020-05-02 DIAGNOSIS — L538 Other specified erythematous conditions: Secondary | ICD-10-CM

## 2020-05-02 MED ORDER — DOXYCYCLINE HYCLATE 100 MG PO CAPS
100.0000 mg | ORAL_CAPSULE | Freq: Two times a day (BID) | ORAL | 0 refills | Status: DC
Start: 2020-05-02 — End: 2020-05-19

## 2020-05-02 MED ORDER — CEPHALEXIN 500 MG PO CAPS
500.0000 mg | ORAL_CAPSULE | Freq: Four times a day (QID) | ORAL | 0 refills | Status: DC
Start: 2020-05-02 — End: 2020-05-02

## 2020-05-02 NOTE — BH Assessment (Signed)
BHH Assessment Progress Note  Per Berneice Heinrich, FNP, this pt does not require psychiatric hospitalization at this time.  Pt is to be discharged from [facility] with referral information for substance abuse treatment providers in both the [place] and [place] areas.  This has been included in pt's discharge instructions.  Pt would also benefit from seeing Peer Support Specialists, and a peer support consult has been ordered for pt.  Pt's nurse, Kendal Hymen, has been notified.  Doylene Canning, MA Triage Specialist [phone]

## 2020-05-02 NOTE — ED Provider Notes (Signed)
Emergency Medicine Observation Re-evaluation Note  Michael Farrell is a 49 y.o. male, seen on rounds today.  Pt initially presented to the ED for complaints of Hallucinations Currently, the patient is calm and comfortable.  Physical Exam  BP 103/73 (BP Location: Left Arm)   Pulse 90   Temp 97.8 F (36.6 C) (Oral)   Resp 18   Ht 6\' 2"  (1.88 m)   Wt 110.7 kg   SpO2 95%   BMI 31.33 kg/m  Physical Exam Vitals and nursing note reviewed.  Cardiovascular:     Rate and Rhythm: Normal rate.  Pulmonary:     Effort: No respiratory distress.  Musculoskeletal:        General: No swelling or tenderness.  Skin:    Findings: Rash present.  Neurological:     Mental Status: He is alert.     ED Course / MDM  EKG:EKG Interpretation  Date/Time:  Tuesday Apr 30 2020 13:40:26 EDT Ventricular Rate:  130 PR Interval:    QRS Duration: 87 QT Interval:  325 QTC Calculation: 478 R Axis:   -24 Text Interpretation: Sinus tachycardia Borderline left axis deviation Borderline prolonged QT interval Confirmed by 04-04-2000 (Michael Farrell) on 04/30/2020 7:08:55 PM  Clinical Course as of May 02 1252  Tue Apr 30, 2020  1552 WBC(!): 16.8 [CA]    Clinical Course User Index [CA] May 02, 2020, PA-C   I have reviewed the labs performed to date as well as medications administered while in observation.  Recent changes in the last 24 hours include : None  Plan  Current plan is for to get ultrasound to rule out DVT.  Patient is noted to have left lower extremity erythema and he has history of DVTs.  If the DVT study is negative then we will treat him like cellulitis.  He is likely being discharged from psych perspective. Patient is not under full IVC at this time.   Michael Stabile, MD 05/02/20 1254

## 2020-05-02 NOTE — Discharge Instructions (Signed)
To help you maintain a sober lifestyle, a substance abuse treatment program may be beneficial to you.  Contact one of the following facilities at your earliest opportunity to ask about enrolling:  RESIDENTIAL PROGRAMS:       [facility]      [address] [place], [place] [address]      [phone]       [facility]      [address]      [place], [place] [address]      [phone]  OUTPATIENT PROGRAMS:  In the [facility] area:       [facility]      [address] [address]      [phone]      New patients are seen at the walk-in clinic Mondays, Wednesdays and Fridays, 1:00 pm - 3:00 pm  In the [place] area:       [facility]      [address]      [place], [place] [address]      [phone]

## 2020-05-02 NOTE — Consult Note (Signed)
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 Lucianne Muss.  Patient alert and oriented, answers appropriately.  Patient denies suicidal and homicidal ideations.  Patient denies any history of self-harm behavior.  Patient denies auditory and visual hallucinations.  Patient denies symptoms of paranoia. Patient reports he lives in a tent in [place].  Patient currently unemployed.  Patient denies access to weapons.  Patient reports occasional alcohol use and daily methamphetamine use.  Patient offered peers support consult, reports he is not currently ready to stop substance use. Patient refuses any contact for collateral information at this time.  .  Total Time spent with patient: 30 minutes  Past Psychiatric History: Amphetamine induced psychotic disorder, amphetamine use disorder, benzodiazepine use disorder  Past Medical History:  Past Medical History:  Diagnosis Date  . Anxiety   . Depression   . Pulmonary embolus [facility])     Past Surgical History:  Procedure Laterality Date  . CLAVICLE SURGERY     pt unsure whether it was left or right; has been broken "a couple times"  . LEG AMPUTATION     Family History:  Family History  Family history unknown: Yes   Family Psychiatric  History: Unknown Social History:  Social History   Substance and Sexual Activity  Alcohol Use Not Currently     Social History   Substance and Sexual Activity  Drug Use Yes  . Types: Marijuana    Social History   Socioeconomic History  . Marital  status: Unknown    Spouse name: Not on file  . Number of children: Not on file  . Years of education: Not on file  . Highest education level: Not on file  Occupational History  . Not on file  Tobacco Use  . Smoking status: Current Every Day Smoker    Packs/day: 1.00    Types: Cigarettes  . Smokeless tobacco: Former Engineer, water and Sexual Activity  . Alcohol use: Not Currently  . Drug use: Yes    Types: Marijuana  . Sexual activity: Not on file  Other Topics Concern  . Not on file  Social History Narrative  . Not on file   Social Determinants of Health   Financial Resource Strain:   . Difficulty of Paying Living Expenses:   Food Insecurity:   . Worried About Programme researcher, broadcasting/film/video in the Last Year:   . Barista in the Last Year:   Transportation Needs:   . Freight forwarder (Medical):   Marland Kitchen Lack of Transportation (Non-Medical):   Physical Activity:   . Days of Exercise per Week:   . Minutes of Exercise per Session:   Stress:   . Feeling of Stress :   Social Connections:   . Frequency of Communication with Friends and Family:   . Frequency of Social Gatherings with Friends and Family:   . Attends Religious Services:   . Active Member of Clubs or Organizations:   . Attends Banker Meetings:   Marland Kitchen Marital Status:     Has this patient used any form of  tobacco in the last 30 days? (Cigarettes, Smokeless Tobacco, Cigars, and/or Pipes) A prescription for an FDA-approved tobacco cessation medication was offered at discharge and the patient refused  Current Medications: Current Facility-Administered Medications  Medication Dose Route Frequency Provider Last Rate Last Admin  . acetaminophen (TYLENOL) tablet 650 mg  650 mg Oral Q4H PRN Suzy Bouchard, PA-C   650 mg at 05/01/20 0500  . alum & mag hydroxide-simeth (MAALOX/MYLANTA) 200-200-20 MG/5ML suspension 30 mL  30 mL Oral Q6H PRN Aberman, Caroline C, PA-C      . chlordiazePOXIDE (LIBRIUM) capsule  25 mg  25 mg Oral Q6H PRN Rankin, Shuvon B, NP      . escitalopram (LEXAPRO) tablet 10 mg  10 mg Oral Daily Drenda Freeze, MD   10 mg at 05/02/20 1013  . gabapentin (NEURONTIN) capsule 400 mg  400 mg Oral TID Drenda Freeze, MD   400 mg at 05/02/20 1013  . hydrOXYzine (ATARAX/VISTARIL) tablet 25 mg  25 mg Oral Q6H PRN Rankin, Shuvon B, NP      . loperamide (IMODIUM) capsule 2-4 mg  2-4 mg Oral PRN Rankin, Shuvon B, NP      . metoprolol tartrate (LOPRESSOR) tablet 25 mg  25 mg Oral BID Drenda Freeze, MD   25 mg at 05/02/20 1013  . multivitamin with minerals tablet 1 tablet  1 tablet Oral Daily Rankin, Shuvon B, NP   1 tablet at 05/02/20 1013  . OLANZapine (ZYPREXA) tablet 5 mg  5 mg Oral BID Rankin, Shuvon B, NP   5 mg at 05/02/20 1013  . ondansetron (ZOFRAN-ODT) disintegrating tablet 4 mg  4 mg Oral Q6H PRN Rankin, Shuvon B, NP      . thiamine tablet 100 mg  100 mg Oral Daily Charmaine Downs C, PA-C   100 mg at 05/02/20 1013   Or  . thiamine (B-1) injection 100 mg  100 mg Intravenous Daily Suzy Bouchard, PA-C       Current Outpatient Medications  Medication Sig Dispense Refill  . apixaban (ELIQUIS) 5 MG TABS tablet Take 5 mg by mouth 2 (two) times daily.    Marland Kitchen aspirin EC 81 MG tablet Take 81 mg by mouth daily.    . cyclobenzaprine (FLEXERIL) 10 MG tablet Take 10 mg by mouth 3 (three) times daily as needed for muscle spasms.    Marland Kitchen escitalopram (LEXAPRO) 10 MG tablet Take 10 mg by mouth daily.    Marland Kitchen gabapentin (NEURONTIN) 400 MG capsule Take 400 mg by mouth 3 (three) times daily.    . metoprolol tartrate (LOPRESSOR) 25 MG tablet Take 25 mg by mouth 2 (two) times daily.    Marland Kitchen HYDROcodone-acetaminophen (NORCO) 5-325 MG tablet Take 1 tablet by mouth every 6 (six) hours as needed for moderate pain. (Patient not taking: Reported on 05/01/2020) 15 tablet 0  . predniSONE (DELTASONE) 10 MG tablet Take 1 tablet (10 mg total) by mouth daily. 6,5,4,3,2,1 six day taper (Patient not taking:  Reported on 05/01/2020) 21 tablet 0   PTA Medications: (Not in a hospital admission)   Musculoskeletal: Strength & Muscle Tone: within normal limits Gait & Station: normal Patient leans: N/A  Psychiatric Specialty Exam: Physical Exam Vitals and nursing note reviewed.  Constitutional:      Appearance: He is well-developed.  HENT:     Head: Normocephalic.  Cardiovascular:     Rate and Rhythm: Normal rate.  Pulmonary:     Effort: Pulmonary effort is normal.  Neurological:  Mental Status: He is alert and oriented to person, place, and time.  Psychiatric:        Mood and Affect: Mood normal.        Behavior: Behavior normal.        Thought Content: Thought content normal.        Judgment: Judgment normal.     Review of Systems  Constitutional: Negative.   HENT: Negative.   Eyes: Negative.   Respiratory: Negative.   Cardiovascular: Negative.   Gastrointestinal: Negative.   Genitourinary: Negative.   Musculoskeletal: Negative.   Skin: Negative.   Neurological: Negative.   Psychiatric/Behavioral: Negative.     Blood pressure 103/73, pulse 90, temperature 97.8 F (36.6 C), temperature source Oral, resp. rate 18, height 6\' 2"  (1.88 m), weight 110.7 kg, SpO2 95 %.Body mass index is 31.33 kg/m.  General Appearance: Casual and Fairly Groomed  Eye Contact:  Good  Speech:  Clear and Coherent and Normal Rate  Volume:  Normal  Mood:  Depressed  Affect:  Appropriate and Congruent  Thought Process:  Coherent, Goal Directed and Descriptions of Associations: Intact  Orientation:  Full (Time, Place, and Person)  Thought Content:  Logical  Suicidal Thoughts:  No  Homicidal Thoughts:  No  Memory:  Immediate;   Good Recent;   Good Remote;   Good  Judgement:  Fair  Insight:  Fair  Psychomotor Activity:  Normal  Concentration:  Concentration: Good and Attention Span: Good    Recall:  Good  Fund of Knowledge:  Good  Language:  Good  Akathisia:  No  Handed:  Right  AIMS  (if indicated):     Assets:  Communication Skills Desire for Improvement Financial Resources/Insurance Intimacy Leisure Time Physical Health Resilience Social Support  ADL's:  Intact  Cognition:  WNL  Sleep:        Demographic Factors:  Male and Caucasian  Loss Factors: NA  Historical Factors: NA  Risk Reduction Factors:   Positive social support, Positive therapeutic relationship and Positive coping skills or problem solving skills  Continued Clinical Symptoms:  Alcohol/Substance Abuse/Dependencies  Cognitive Features That Contribute To Risk:  None    Suicide Risk:  Minimal: No identifiable suicidal ideation.  Patients presenting with no risk factors but with morbid ruminations; may be classified as minimal risk based on the severity of the depressive symptoms    Plan Of Care/Follow-up recommendations:  Other:  Follow-up with substance use treatment resources  Disposition: Discharge , FNP 05/02/2020, 2:02 PM

## 2020-05-02 NOTE — ED Notes (Signed)
Dr Rhunette Croft notified that patient has not been given his Eliquis.  Because the patient is getting discharged soon we gave him a dose from his own meds.  Also notified MD of new rash on patients inner thigh.

## 2020-05-02 NOTE — Progress Notes (Signed)
Lower venous duplex       has been completed. Preliminary results can be found under CV proc through chart review. Jill Parker, BS, RDMS, RVT   

## 2020-05-02 NOTE — Patient Outreach (Signed)
CPSS spoke with Pt an was made aware that he feels that he can just stop on his own. CPSS processed with Pt about a few options that Pt may benefit from.

## 2020-05-16 ENCOUNTER — Other Ambulatory Visit: Payer: Self-pay

## 2020-05-16 ENCOUNTER — Inpatient Hospital Stay (HOSPITAL_COMMUNITY)
Admission: AD | Admit: 2020-05-16 | Discharge: 2020-05-19 | DRG: 918 | Disposition: A | Discharge status: Home / Self Care | Payer: Medicaid Other | Source: Intra-hospital | Attending: Psychiatry | Admitting: Psychiatry

## 2020-05-16 ENCOUNTER — Encounter (HOSPITAL_COMMUNITY): Payer: Self-pay | Admitting: Psychiatry

## 2020-05-16 ENCOUNTER — Emergency Department (HOSPITAL_COMMUNITY)
Admission: EM | Admit: 2020-05-16 | Discharge: 2020-05-16 | Disposition: A | Discharge status: Other Acute Inpatient Hospital | Payer: Medicaid Other | Attending: Emergency Medicine | Admitting: Emergency Medicine

## 2020-05-16 DIAGNOSIS — Z20822 Contact with and (suspected) exposure to covid-19: Secondary | ICD-10-CM | POA: Diagnosis present

## 2020-05-16 DIAGNOSIS — F19188 Other psychoactive substance abuse with other psychoactive substance-induced disorder: Secondary | ICD-10-CM | POA: Diagnosis not present

## 2020-05-16 DIAGNOSIS — Z7982 Long term (current) use of aspirin: Secondary | ICD-10-CM

## 2020-05-16 DIAGNOSIS — F15159 Other stimulant abuse with stimulant-induced psychotic disorder, unspecified: Secondary | ICD-10-CM | POA: Diagnosis present

## 2020-05-16 DIAGNOSIS — R45851 Suicidal ideations: Secondary | ICD-10-CM | POA: Insufficient documentation

## 2020-05-16 DIAGNOSIS — Z7901 Long term (current) use of anticoagulants: Secondary | ICD-10-CM

## 2020-05-16 DIAGNOSIS — R440 Auditory hallucinations: Secondary | ICD-10-CM | POA: Diagnosis present

## 2020-05-16 DIAGNOSIS — F17210 Nicotine dependence, cigarettes, uncomplicated: Secondary | ICD-10-CM | POA: Insufficient documentation

## 2020-05-16 DIAGNOSIS — F13239 Sedative, hypnotic or anxiolytic dependence with withdrawal, unspecified: Secondary | ICD-10-CM | POA: Diagnosis present

## 2020-05-16 DIAGNOSIS — F15959 Other stimulant use, unspecified with stimulant-induced psychotic disorder, unspecified: Secondary | ICD-10-CM | POA: Diagnosis present

## 2020-05-16 DIAGNOSIS — T1491XA Suicide attempt, initial encounter: Secondary | ICD-10-CM

## 2020-05-16 DIAGNOSIS — Z915 Personal history of self-harm: Secondary | ICD-10-CM

## 2020-05-16 DIAGNOSIS — Z89511 Acquired absence of right leg below knee: Secondary | ICD-10-CM | POA: Insufficient documentation

## 2020-05-16 DIAGNOSIS — F32A Depression, unspecified: Secondary | ICD-10-CM | POA: Diagnosis present

## 2020-05-16 DIAGNOSIS — Z86711 Personal history of pulmonary embolism: Secondary | ICD-10-CM | POA: Insufficient documentation

## 2020-05-16 DIAGNOSIS — Z79899 Other long term (current) drug therapy: Secondary | ICD-10-CM

## 2020-05-16 DIAGNOSIS — F329 Major depressive disorder, single episode, unspecified: Secondary | ICD-10-CM | POA: Diagnosis present

## 2020-05-16 DIAGNOSIS — F419 Anxiety disorder, unspecified: Secondary | ICD-10-CM | POA: Diagnosis present

## 2020-05-16 DIAGNOSIS — G4700 Insomnia, unspecified: Secondary | ICD-10-CM | POA: Diagnosis present

## 2020-05-16 DIAGNOSIS — F339 Major depressive disorder, recurrent, unspecified: Secondary | ICD-10-CM | POA: Insufficient documentation

## 2020-05-16 DIAGNOSIS — T45512A Poisoning by anticoagulants, intentional self-harm, initial encounter: Secondary | ICD-10-CM | POA: Diagnosis present

## 2020-05-16 DIAGNOSIS — T50992A Poisoning by other drugs, medicaments and biological substances, intentional self-harm, initial encounter: Secondary | ICD-10-CM | POA: Diagnosis not present

## 2020-05-16 DIAGNOSIS — I10 Essential (primary) hypertension: Secondary | ICD-10-CM | POA: Diagnosis present

## 2020-05-16 DIAGNOSIS — F332 Major depressive disorder, recurrent severe without psychotic features: Secondary | ICD-10-CM | POA: Diagnosis present

## 2020-05-16 DIAGNOSIS — Y929 Unspecified place or not applicable: Secondary | ICD-10-CM

## 2020-05-16 DIAGNOSIS — T426X2A Poisoning by other antiepileptic and sedative-hypnotic drugs, intentional self-harm, initial encounter: Principal | ICD-10-CM | POA: Diagnosis present

## 2020-05-16 DIAGNOSIS — Z91410 Personal history of adult physical and sexual abuse: Secondary | ICD-10-CM

## 2020-05-16 LAB — RAPID URINE DRUG SCREEN, HOSP PERFORMED
Amphetamines: NOT DETECTED
Barbiturates: NOT DETECTED
Benzodiazepines: NOT DETECTED
Cocaine: NOT DETECTED
Opiates: NOT DETECTED
Tetrahydrocannabinol: NOT DETECTED

## 2020-05-16 LAB — SALICYLATE LEVEL: Salicylate Lvl: <7 mg/dL — ABNORMAL LOW (ref 7.0–30.0)

## 2020-05-16 LAB — CBC
HCT: 43.8 % (ref 39.0–52.0)
Hemoglobin: 14 g/dL (ref 13.0–17.0)
MCH: 28.4 pg (ref 26.0–34.0)
MCHC: 32 g/dL (ref 30.0–36.0)
MCV: 88.8 fL (ref 80.0–100.0)
Platelets: 328 10*3/uL (ref 150–400)
RBC: 4.93 MIL/uL (ref 4.22–5.81)
RDW: 14 % (ref 11.5–15.5)
WBC: 15.3 10*3/uL — ABNORMAL HIGH (ref 4.0–10.5)
nRBC: 0 % (ref 0.0–0.2)

## 2020-05-16 LAB — ACETAMINOPHEN LEVEL: Acetaminophen (Tylenol), Serum: <10 ug/mL — ABNORMAL LOW (ref 10–30)

## 2020-05-16 LAB — COMPREHENSIVE METABOLIC PANEL
ALT: 17 U/L (ref 0–44)
AST: 21 U/L (ref 15–41)
Albumin: 3.8 g/dL (ref 3.5–5.0)
Alkaline Phosphatase: 87 U/L (ref 38–126)
Anion gap: 14 (ref 5–15)
BUN: 11 mg/dL (ref 6–20)
CO2: 22 mmol/L (ref 22–32)
Calcium: 8.9 mg/dL (ref 8.9–10.3)
Chloride: 105 mmol/L (ref 98–111)
Creatinine, Ser: 0.8 mg/dL (ref 0.61–1.24)
GFR calc Af Amer: >60 mL/min (ref >60)
GFR calc non Af Amer: >60 mL/min (ref >60)
Glucose, Bld: 83 mg/dL (ref 70–99)
Potassium: 3.9 mmol/L (ref 3.5–5.1)
Sodium: 141 mmol/L (ref 135–145)
Total Bilirubin: 0.6 mg/dL (ref 0.3–1.2)
Total Protein: 6.9 g/dL (ref 6.5–8.1)

## 2020-05-16 LAB — CBG MONITORING, ED: Glucose-Capillary: 94 mg/dL (ref 70–99)

## 2020-05-16 LAB — ETHANOL: Alcohol, Ethyl (B): <10 mg/dL (ref <10)

## 2020-05-16 LAB — SARS CORONAVIRUS 2 BY RT PCR (HOSPITAL ORDER, PERFORMED IN ~~LOC~~ HOSPITAL LAB): SARS Coronavirus 2: NEGATIVE

## 2020-05-16 NOTE — ED Notes (Addendum)
Pt states he took medications the night before last; denies SI at this time

## 2020-05-16 NOTE — ED Notes (Signed)
Gavin Pound, RN at [facility], notified that pt has been medically cleared; plan for TTS assessment

## 2020-05-16 NOTE — ED Notes (Signed)
All of pt's belongings in soiled utility in purple zone (wheelchair, pink backpack, black duffel bag, belongings bag, prosthetic leg)

## 2020-05-16 NOTE — ED Triage Notes (Signed)
Pt reports SI with overdose, pt states he took all of his medications last night.   Pt took: 13 800mg  gabapentin 5 Eliquis 4 "blue pills" 2 "of the 32s" 2 cyclobezepine And a few others of his medications.

## 2020-05-16 NOTE — BH Assessment (Addendum)
Patient accepted to the Observation Unit-Room 202. Accepted by Dr. Lucianne Muss. The attending is Dr. Jola Babinski. Nurse report [phone].

## 2020-05-16 NOTE — ED Notes (Signed)
Pt changed into purple scrubs, belongings gathered and placed at nurses' station to be inventoried

## 2020-05-16 NOTE — ED Provider Notes (Signed)
[facility] EMERGENCY DEPARTMENT Provider Note   CSN: [id number] Arrival date & time: 05/16/20  1007     History Chief Complaint  Patient presents with  . Suicidal    Michael Farrell. is a 49 y.o. male.  HPI Patient presents after suicide attempt by overdose.  Reportedly took pills either a day and a half or 2 days ago.  Nursing note states last night but patient states it was not last night.  States he did not attempt to hurt himself.  Took at least 13 of his 800 mg gabapentin.  5 of his Eliquis pills.  For "blue pills" that patient states was told was Neurontin.  Took 2 "of the 32s".  Reviewing patient's medication list this may have been his metoprolol.  To Flexeril.  May be a few other pills.  Presents with many pill bottles most of which still have medications in them.  States he has been hallucinating.  States he has been seeing things for a while when he uses his methamphetamine.  States that he has been clean for about 2 to 3 weeks now and has had now more auditory hallucinations.  States he has voices telling him to do things.  States he smokes the amphetamine but does not inject.  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  Does walk around with a prosthesis but uses a wheelchair to steady himself.    Past Medical History:  Diagnosis Date  . Anxiety   . Depression   . Pulmonary embolus [facility])     Patient Active Problem List   Diagnosis Date Noted  . Amphetamine abuse ([place]) 05/01/2020  . Amphetamine-induced psychotic disorder ([place]) 05/01/2020  . Moderate benzodiazepine use disorder ([place]) 05/01/2020    Past Surgical History:  Procedure Laterality Date  . CLAVICLE SURGERY     pt unsure whether it was left or right; has been broken "a couple times"  . LEG AMPUTATION         Family History  Family history unknown: Yes    Social History   Tobacco Use  . Smoking status: Current Every Day Smoker    Packs/day: 1.00    Types:  Cigarettes  . Smokeless tobacco: Former Network engineer Use Topics  . Alcohol use: Not Currently  . Drug use: Yes    Types: Marijuana    Home Medications Prior to Admission medications   Medication Sig Start Date End Date Taking? Authorizing Provider  doxycycline (VIBRAMYCIN) 100 MG capsule Take 1 capsule (100 mg total) by mouth 2 (two) times daily. Patient not taking: Reported on 05/16/2020 05/02/20   Varney Biles, MD  escitalopram (LEXAPRO) 10 MG tablet Take 10 mg by mouth daily.    [provider]  HYDROcodone-acetaminophen (NORCO) 5-325 MG tablet Take 1 tablet by mouth every 6 (six) hours as needed for moderate pain. Patient not taking: Reported on 05/01/2020 12/10/17   Duanne Guess, PA-C  metoprolol tartrate (LOPRESSOR) 25 MG tablet Take 25 mg by mouth 2 (two) times daily.    [provider]  predniSONE (DELTASONE) 10 MG tablet Take 1 tablet (10 mg total) by mouth daily. 6,5,4,3,2,1 six day taper Patient not taking: Reported on 05/01/2020 12/10/17   Duanne Guess, PA-C    Allergies    Patient has no known allergies.  Review of Systems   Review of Systems  Constitutional: Negative for appetite change.  HENT: Negative for congestion.   Respiratory: Negative for shortness  of breath.   Gastrointestinal: Negative for abdominal pain.  Genitourinary: Negative for flank pain.  Musculoskeletal:       Chronic right lower extremity pain.  Skin: Negative for rash.  Neurological: Negative for weakness.  Psychiatric/Behavioral: Positive for hallucinations and suicidal ideas.    Physical Exam Updated Vital Signs BP 101/74   Pulse 92   Temp 98 F (36.7 C) (Oral)   Resp 20   SpO2 95%   Physical Exam Vitals and nursing note reviewed.  HENT:     Head: Normocephalic.  Eyes:     Extraocular Movements: Extraocular movements intact.  Cardiovascular:     Rate and Rhythm: Regular rhythm.  Pulmonary:     Breath sounds: No wheezing or rhonchi.  Abdominal:      Tenderness: There is no abdominal tenderness.  Musculoskeletal:     Cervical back: Neck supple.     Comments: Previous right below the knee amputation.  Skin:    Capillary Refill: Capillary refill takes less than 2 seconds.     Comments: Superficial linear abrasion/laceration to anterior aspect left forearm.  Does not go through the skin.  Neurological:     Mental Status: He is alert and oriented to person, place, and time.     ED Results / Procedures / Treatments   Labs (all labs ordered are listed, but only abnormal results are displayed) Labs Reviewed  SALICYLATE LEVEL - Abnormal; Notable for the following components:      Result Value   Salicylate Lvl <7.0 (*)    All other components within normal limits  ACETAMINOPHEN LEVEL - Abnormal; Notable for the following components:   Acetaminophen (Tylenol), Serum <10 (*)    All other components within normal limits  CBC - Abnormal; Notable for the following components:   WBC 15.3 (*)    All other components within normal limits  SARS CORONAVIRUS 2 BY RT PCR (HOSPITAL ORDER, PERFORMED IN [facility])  COMPREHENSIVE METABOLIC PANEL  ETHANOL  RAPID URINE DRUG SCREEN, HOSP PERFORMED  CBG MONITORING, ED    EKG EKG Interpretation  Date/Time:  Thursday May 16 2020 10:41:08 EDT Ventricular Rate:  93 PR Interval:    QRS Duration: 97 QT Interval:  369 QTC Calculation: 459 R Axis:   56 Text Interpretation: Sinus rhythm RSR' in V1 or V2, probably normal variant Confirmed by Michael Farrell [phone]) on 05/16/2020 11:42:04 AM   Radiology No results found.  Procedures Procedures (including critical care time)  Medications Ordered in ED Medications - No data to display  ED Course  I have reviewed the triage vital signs and the nursing notes.  Pertinent labs & imaging results that were available during my care of the patient were reviewed by me and considered in my medical decision making (see chart for  details).    MDM Rules/Calculators/A&P                      Patient with multiple medication overdose although was between 2 and 3 days ago.  At this point labs pending but patient is medically cleared.  Should have metabolized any of the medicines and now free would not be a risk with the overdose.  However states has been hallucinating and states it was a suicide attempt.  Will have patient seen by TTS.  Also history of methamphetamine abuse although states he has not used in the last 2 to 3 weeks.  Lab work back and reassuring.  Patient is medically  cleared.  To be seen by TTS Final Clinical Impression(s) / ED Diagnoses Final diagnoses:  Suicide attempt [facility])    Rx / DC Orders ED Discharge Orders    None       Michael Core, MD 05/16/20 1206

## 2020-05-16 NOTE — ED Notes (Signed)
  Safe transport called for pickup.   All belongings including wheelchair, clothing tote, dufflebags, prosthetic leg, medications, and secure envelope with 2 pocket knives given to safe transport member Alona Bene.

## 2020-05-16 NOTE — ED Notes (Signed)
lunch tray ordered
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
## 2020-05-16 NOTE — ED Notes (Signed)
Dinner ordered 

## 2020-05-16 NOTE — ED Notes (Signed)
Lunch tray ordered 

## 2020-05-17 DIAGNOSIS — R0602 Shortness of breath: Secondary | ICD-10-CM | POA: Diagnosis not present

## 2020-05-17 DIAGNOSIS — F333 Major depressive disorder, recurrent, severe with psychotic symptoms: Secondary | ICD-10-CM | POA: Diagnosis not present

## 2020-05-17 DIAGNOSIS — Z7982 Long term (current) use of aspirin: Secondary | ICD-10-CM | POA: Diagnosis not present

## 2020-05-17 DIAGNOSIS — Z86711 Personal history of pulmonary embolism: Secondary | ICD-10-CM | POA: Diagnosis not present

## 2020-05-17 DIAGNOSIS — R0789 Other chest pain: Secondary | ICD-10-CM | POA: Diagnosis not present

## 2020-05-17 DIAGNOSIS — I10 Essential (primary) hypertension: Secondary | ICD-10-CM | POA: Diagnosis present

## 2020-05-17 DIAGNOSIS — F17210 Nicotine dependence, cigarettes, uncomplicated: Secondary | ICD-10-CM | POA: Diagnosis present

## 2020-05-17 DIAGNOSIS — I2699 Other pulmonary embolism without acute cor pulmonale: Secondary | ICD-10-CM | POA: Diagnosis not present

## 2020-05-17 DIAGNOSIS — Z79899 Other long term (current) drug therapy: Secondary | ICD-10-CM | POA: Diagnosis not present

## 2020-05-17 DIAGNOSIS — F15159 Other stimulant abuse with stimulant-induced psychotic disorder, unspecified: Secondary | ICD-10-CM | POA: Diagnosis present

## 2020-05-17 DIAGNOSIS — T426X2A Poisoning by other antiepileptic and sedative-hypnotic drugs, intentional self-harm, initial encounter: Secondary | ICD-10-CM | POA: Diagnosis not present

## 2020-05-17 DIAGNOSIS — F1520 Other stimulant dependence, uncomplicated: Secondary | ICD-10-CM | POA: Diagnosis not present

## 2020-05-17 DIAGNOSIS — Y929 Unspecified place or not applicable: Secondary | ICD-10-CM | POA: Diagnosis not present

## 2020-05-17 DIAGNOSIS — Z915 Personal history of self-harm: Secondary | ICD-10-CM | POA: Diagnosis not present

## 2020-05-17 DIAGNOSIS — G4700 Insomnia, unspecified: Secondary | ICD-10-CM | POA: Diagnosis present

## 2020-05-17 DIAGNOSIS — F329 Major depressive disorder, single episode, unspecified: Secondary | ICD-10-CM | POA: Diagnosis present

## 2020-05-17 DIAGNOSIS — Z91410 Personal history of adult physical and sexual abuse: Secondary | ICD-10-CM | POA: Diagnosis not present

## 2020-05-17 DIAGNOSIS — Z20822 Contact with and (suspected) exposure to covid-19: Secondary | ICD-10-CM | POA: Diagnosis present

## 2020-05-17 DIAGNOSIS — F1994 Other psychoactive substance use, unspecified with psychoactive substance-induced mood disorder: Secondary | ICD-10-CM | POA: Diagnosis not present

## 2020-05-17 DIAGNOSIS — R079 Chest pain, unspecified: Secondary | ICD-10-CM | POA: Diagnosis present

## 2020-05-17 DIAGNOSIS — F332 Major depressive disorder, recurrent severe without psychotic features: Secondary | ICD-10-CM | POA: Diagnosis present

## 2020-05-17 DIAGNOSIS — F13239 Sedative, hypnotic or anxiolytic dependence with withdrawal, unspecified: Secondary | ICD-10-CM | POA: Diagnosis present

## 2020-05-17 DIAGNOSIS — Z9114 Patient's other noncompliance with medication regimen: Secondary | ICD-10-CM | POA: Diagnosis not present

## 2020-05-17 DIAGNOSIS — T45512A Poisoning by anticoagulants, intentional self-harm, initial encounter: Secondary | ICD-10-CM | POA: Diagnosis present

## 2020-05-17 DIAGNOSIS — F19959 Other psychoactive substance use, unspecified with psychoactive substance-induced psychotic disorder, unspecified: Secondary | ICD-10-CM | POA: Diagnosis not present

## 2020-05-17 DIAGNOSIS — F419 Anxiety disorder, unspecified: Secondary | ICD-10-CM | POA: Diagnosis present

## 2020-05-17 DIAGNOSIS — Z7901 Long term (current) use of anticoagulants: Secondary | ICD-10-CM | POA: Diagnosis not present

## 2020-05-17 DIAGNOSIS — F15951 Other stimulant use, unspecified with stimulant-induced psychotic disorder with hallucinations: Secondary | ICD-10-CM | POA: Diagnosis not present

## 2020-05-17 DIAGNOSIS — Z89511 Acquired absence of right leg below knee: Secondary | ICD-10-CM | POA: Diagnosis not present

## 2020-05-17 DIAGNOSIS — F32A Depression, unspecified: Secondary | ICD-10-CM | POA: Diagnosis present

## 2020-05-17 MED ORDER — HYDROXYZINE HCL 25 MG PO TABS
25.0000 mg | ORAL_TABLET | Freq: Four times a day (QID) | ORAL | Status: DC | PRN
  Administered 2020-05-18: 25 mg via ORAL
  Filled 2020-05-17: qty 1
  Filled 2020-05-17: qty 10

## 2020-05-17 MED ORDER — CHLORDIAZEPOXIDE HCL 25 MG PO CAPS: 25.0000 mg | ORAL_CAPSULE | Freq: Four times a day (QID) | ORAL | Status: DC | PRN

## 2020-05-17 MED ORDER — METOPROLOL TARTRATE 25 MG PO TABS
25.0000 mg | ORAL_TABLET | Freq: Two times a day (BID) | ORAL | Status: DC
  Administered 2020-05-17 – 2020-05-19 (×3): 25 mg via ORAL
  Filled 2020-05-17 (×3): qty 1
  Filled 2020-05-17: qty 14
  Filled 2020-05-17 (×2): qty 1
  Filled 2020-05-17: qty 14
  Filled 2020-05-17 (×2): qty 1

## 2020-05-17 MED ORDER — OLANZAPINE 10 MG PO TBDP: 10.0000 mg | ORAL_TABLET | Freq: Three times a day (TID) | ORAL | Status: DC | PRN

## 2020-05-17 MED ORDER — NAPROXEN 500 MG PO TABS
500.0000 mg | ORAL_TABLET | Freq: Two times a day (BID) | ORAL | Status: DC | PRN
  Administered 2020-05-17: 500 mg via ORAL
  Filled 2020-05-17: qty 1

## 2020-05-17 MED ORDER — LORAZEPAM 1 MG PO TABS: 1.0000 mg | ORAL_TABLET | ORAL | Status: DC | PRN

## 2020-05-17 MED ORDER — ZIPRASIDONE MESYLATE 20 MG IM SOLR: 20.0000 mg | INTRAMUSCULAR | Status: DC | PRN

## 2020-05-17 MED ORDER — APIXABAN 5 MG PO TABS
5.0000 mg | ORAL_TABLET | Freq: Two times a day (BID) | ORAL | Status: DC
  Administered 2020-05-17 – 2020-05-19 (×5): 5 mg via ORAL
  Filled 2020-05-17 (×8): qty 1
  Filled 2020-05-17 (×2): qty 14
  Filled 2020-05-17: qty 1

## 2020-05-17 MED ORDER — ESCITALOPRAM OXALATE 10 MG PO TABS
10.0000 mg | ORAL_TABLET | Freq: Every day | ORAL | Status: DC
  Administered 2020-05-17: 10 mg via ORAL
  Filled 2020-05-17: qty 1

## 2020-05-17 MED ORDER — ESCITALOPRAM OXALATE 20 MG PO TABS
20.0000 mg | ORAL_TABLET | Freq: Every day | ORAL | Status: DC
  Administered 2020-05-18 – 2020-05-19 (×2): 20 mg via ORAL
  Filled 2020-05-17 (×3): qty 1
  Filled 2020-05-17: qty 7

## 2020-05-17 MED ORDER — ONDANSETRON 4 MG PO TBDP: 4.0000 mg | ORAL_TABLET | Freq: Four times a day (QID) | ORAL | Status: DC | PRN

## 2020-05-17 MED ORDER — DICYCLOMINE HCL 20 MG PO TABS: 20.0000 mg | ORAL_TABLET | Freq: Four times a day (QID) | ORAL | Status: DC | PRN

## 2020-05-17 MED ORDER — OLANZAPINE 10 MG PO TBDP
10.0000 mg | ORAL_TABLET | Freq: Every day | ORAL | Status: DC
  Administered 2020-05-17 – 2020-05-18 (×2): 10 mg via ORAL
  Filled 2020-05-17 (×3): qty 1
  Filled 2020-05-17: qty 7

## 2020-05-17 MED ORDER — LOPERAMIDE HCL 2 MG PO CAPS: 2.0000 mg | ORAL_CAPSULE | ORAL | Status: DC | PRN

## 2020-05-17 MED ORDER — GABAPENTIN 400 MG PO CAPS
400.0000 mg | ORAL_CAPSULE | Freq: Three times a day (TID) | ORAL | Status: DC
  Administered 2020-05-17 – 2020-05-19 (×8): 400 mg via ORAL
  Filled 2020-05-17 (×4): qty 1
  Filled 2020-05-17: qty 21
  Filled 2020-05-17 (×4): qty 1
  Filled 2020-05-17: qty 21
  Filled 2020-05-17 (×2): qty 1
  Filled 2020-05-17: qty 21
  Filled 2020-05-17: qty 1

## 2020-05-17 MED ORDER — CYCLOBENZAPRINE HCL 10 MG PO TABS
10.0000 mg | ORAL_TABLET | Freq: Three times a day (TID) | ORAL | Status: DC | PRN
  Administered 2020-05-18: 10 mg via ORAL
  Filled 2020-05-17: qty 1

## 2020-05-17 MED ORDER — ASPIRIN 81 MG PO TBEC
81.0000 mg | DELAYED_RELEASE_TABLET | Freq: Every day | ORAL | Status: DC
  Administered 2020-05-17 – 2020-05-19 (×3): 81 mg via ORAL
  Filled 2020-05-17: qty 1
  Filled 2020-05-17: qty 7
  Filled 2020-05-17 (×3): qty 1

## 2020-05-17 NOTE — Progress Notes (Signed)
Pt transferred from OBS unit and has been admitted to room 306-1.  RN oriented pt to the unit, his room, and the staff.  Pt is currently in dayroom interacting with his peers. No immediate concerns identified at this time.  RN will continue to monitor and provide support as needed.

## 2020-05-17 NOTE — Plan of Care (Signed)
[facility] Observation Crisis Plan  Reason for Crisis Plan:  Crisis Stabilization   Plan of Care:  Referral for Telepsychiatry/Psychiatric Consult  Family Support:      Current Living Environment:  Living Arrangements: Alone  Insurance:   Hospital Account    Name Acct ID Class Status Primary Coverage   [place], [place] [id number] BEHAVIORAL HEALTH OBSERVATION [facility] FOR MH/DD/SAS - 3-WAY [place]        Guarantor Account (for Hospital Account [id number])    Name Relation to Pt Service Area Active? Acct Type   [name]. Self [facility] Yes Behavioral Health   Address Phone       [place], [place] [address] [phone](H)          Coverage Information (for Hospital Account [id number])    F/O Payor/Plan Precert #   [facility] FOR MH/DD/SAS/3-WAY [facility]    Subscriber Subscriber #   [place], Kadlec [id number]   Address Phone   [address] [place], [place] [address] [phone]      Legal Guardian:     Primary Care Provider:  Lavinia Sharps, NP  Current Outpatient Providers:  none  Psychiatrist:     Counselor/Therapist:     Compliant with Medications:  No  Additional Information:   Linton Rump 6/4/20212:16 AM
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
## 2020-05-17 NOTE — Tx Team (Signed)
Initial Treatment Plan 05/17/2020 4:00 PM Michael Farrell. [id number]:[id number]    PATIENT STRESSORS: Financial problems Marital / Family Conflict "I just broke up with my girlfriend".  PATIENT STRENGTHS: Ability for insight Capable of independent living Communication skills Motivation for treatment/growth   PATIENT IDENTIFIED PROBLEMS: Risk for self harm "I overdosed on bunch of medications in a suicide attempt".     Alterations in mood (Anxiety & Depression)                        DISCHARGE CRITERIA:  Improved stabilization in mood, thinking, and/or behavior Verbal commitment to aftercare and medication compliance  PRELIMINARY DISCHARGE PLAN: Outpatient therapy Placement in alternative living arrangements  PATIENT/FAMILY INVOLVEMENT: This treatment plan has been presented to and reviewed with the patient, Michael Farrell. The patient have been given the opportunity to ask questions and make suggestions.  Sherryl Manges, RN 05/17/2020, 4:00 PM

## 2020-05-17 NOTE — Progress Notes (Signed)
Pt A & O X4. Denies pain and HI when assessed. Continues to endorsed SI, AV & VH "It's always there". Pt presents irritable on initial contact, "I'm sleepy, turn the light off, I just want to sleep". Pt observed masturbating in writer's presence, moving his hands closer to writer during medication administration and vitals this morning. He stopped upon male nurse entering the room, told writer "don't call him back, I want you to see it" giggling while he talks. Writer did called male nurse back into pt's room till medication and vitals were done.  Verbal redirections offered on appropriate interactions with others. Scheduled medications given with verbal education and effects monitored. Q 15 minutes safety checks maintained without self harm gestures.  Pt tolerated all PO intake well. Remains safe on unit.
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
## 2020-05-17 NOTE — Progress Notes (Signed)
Pt transfer to [facility] hall as ordered. 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 Verbal report given to receiving nurse Will, RN. Q 15 minutes safety checks maintained without self harm gestures.
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
## 2020-05-18 DIAGNOSIS — F15951 Other stimulant use, unspecified with stimulant-induced psychotic disorder with hallucinations: Secondary | ICD-10-CM

## 2020-05-18 DIAGNOSIS — F333 Major depressive disorder, recurrent, severe with psychotic symptoms: Secondary | ICD-10-CM

## 2020-05-18 NOTE — Progress Notes (Signed)
[facility] MD Progress Note  05/18/2020 2:40 PM Michael Farrell.  MRN:  [id number]  Subjective: Michael Farrell reports, "I'm doing better. I'm not feeling depressed or anxious today. I feel good".  Objective: Patient is a 49 year old male with a past psychiatric history significant for psychosis, substance abuse, and recent suicide attempt. The patient presented to the [facility] emergency department on 05/16/2020 after a suicide attempt by overdose. He reportedly took the overdose 1-1/2 to 2 days prior to admission. Patient stated in the emergency department that he did not want to harm himself but apparently took 13 800 mg Neurontin tablets and 5 5 mg Eliquis tablets. During his interview in the observation unit at our facility he would not lift his head out from behind the blanket, and was difficult to assess his condition. The rest of the history is collected from the electronic medical record.  [name] is seen chart reviewed. The chart findings discussed with the treatment team. He is lying down in his bed resting. He says he is doing well today. Denies any symptoms of depression or anxiety. He says he came to the hospital because he tried to kill himself by overdose on pills. He says he took the pills because his girlfriend left him. He says this was his first suicide attempt & he has never been admitted in a psychiatric hospital until this one. 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 He uses the wheel chair to get around on the unit. He presents with a good affect, visible on the unit, attending group sessions. He is taking & tolerating his treatment regimen. He denies any side effects. He currently denies any SIHI, AVH, delusional thoughts or paranoia. He does not appear to be responding to any internal stimuli. [name] is in agreement to continue current plan of care as already in progress.  Principal Problem: Amphetamine-induced psychotic disorder (HCC)  Diagnosis:  Principal Problem:   Amphetamine-induced psychotic disorder (HCC) Active Problems:   MDD (major depressive disorder), recurrent episode, severe (HCC)   MDD (major depressive disorder)   Depression  Total Time spent with patient: 25 minutes  Past Psychiatric History: See H&P  Past Medical History:  Past Medical History:  Diagnosis Date  . Anxiety   . Depression   . Pulmonary embolus [facility])     Past Surgical History:  Procedure Laterality Date  . CLAVICLE SURGERY     pt unsure whether it was left or right; has been broken "a couple times"  . LEG AMPUTATION     Family History:  Family History  Family history unknown: Yes   Family Psychiatric  History: See H&P Social History:  Social History   Substance and Sexual Activity  Alcohol Use Not Currently     Social History   Substance and Sexual Activity  Drug Use Yes  . Types: Marijuana    Social History   Socioeconomic History  . Marital status: Single    Spouse name: Not on file  . Number of children: Not on file  . Years of education: Not on file  . Highest education level: Not on file  Occupational History  . Not on file  Tobacco Use  . Smoking status: Current Every Day Smoker    Packs/day: 1.00    Types: Cigarettes  . Smokeless tobacco: Former Engineer, water and Sexual Activity  . Alcohol use: Not Currently  . Drug use: Yes    Types: Marijuana  . Sexual activity:  Not on file  Other Topics Concern  . Not on file  Social History Narrative  . Not on file   Social Determinants of Health   Financial Resource Strain:   . Difficulty of Paying Living Expenses:   Food Insecurity:   . Worried About Programme researcher, broadcasting/film/video in the Last Year:   . Barista in the Last Year:   Transportation Needs:   . Freight forwarder (Medical):   Marland Kitchen Lack of Transportation (Non-Medical):   Physical Activity:   . Days of Exercise per Week:   . Minutes of Exercise per Session:   Stress:   . Feeling of Stress :    Social Connections:   . Frequency of Communication with Friends and Family:   . Frequency of Social Gatherings with Friends and Family:   . Attends Religious Services:   . Active Member of Clubs or Organizations:   . Attends Banker Meetings:   Marland Kitchen Marital Status:    Additional Social History:   Sleep: Fair  Appetite:  Good  Current Medications: Current Facility-Administered Medications  Medication Dose Route Frequency Provider Last Rate Last Admin  . apixaban (ELIQUIS) tablet 5 mg  5 mg Oral BID Antonieta Pert, MD   5 mg at 05/18/20 0800  . aspirin EC tablet 81 mg  81 mg Oral Daily Antonieta Pert, MD   81 mg at 05/18/20 0800  . chlordiazePOXIDE (LIBRIUM) capsule 25 mg  25 mg Oral QID PRN Antonieta Pert, MD      . cyclobenzaprine (FLEXERIL) tablet 10 mg  10 mg Oral TID PRN Antonieta Pert, MD      . dicyclomine (BENTYL) tablet 20 mg  20 mg Oral Q6H PRN Antonieta Pert, MD      . escitalopram (LEXAPRO) tablet 20 mg  20 mg Oral Daily Antonieta Pert, MD   20 mg at 05/18/20 0800  . gabapentin (NEURONTIN) capsule 400 mg  400 mg Oral TID Antonieta Pert, MD   400 mg at 05/18/20 1159  . hydrOXYzine (ATARAX/VISTARIL) tablet 25 mg  25 mg Oral Q6H PRN Antonieta Pert, MD      . loperamide (IMODIUM) capsule 2-4 mg  2-4 mg Oral PRN Antonieta Pert, MD      . OLANZapine zydis (ZYPREXA) disintegrating tablet 10 mg  10 mg Oral Q8H PRN Antonieta Pert, MD       And  . LORazepam (ATIVAN) tablet 1 mg  1 mg Oral PRN Antonieta Pert, MD       And  . ziprasidone (GEODON) injection 20 mg  20 mg Intramuscular PRN Antonieta Pert, MD      . metoprolol tartrate (LOPRESSOR) tablet 25 mg  25 mg Oral BID Antonieta Pert, MD   25 mg at 05/18/20 0800  . naproxen (NAPROSYN) tablet 500 mg  500 mg Oral BID PRN Antonieta Pert, MD   500 mg at 05/17/20 1622  . OLANZapine zydis (ZYPREXA) disintegrating tablet 10 mg  10 mg Oral QHS Antonieta Pert, MD   10 mg  at 05/17/20 2147  . ondansetron (ZOFRAN-ODT) disintegrating tablet 4 mg  4 mg Oral Q6H PRN Antonieta Pert, MD       Lab Results: No results found for this or any previous visit (from the past 48 hour(s)).  Blood Alcohol level:  Lab Results  Component Value Date   ETH <10 05/16/2020   ETH <10 04/30/2020  Metabolic Disorder Labs: No results found for: HGBA1C, MPG No results found for: PROLACTIN No results found for: CHOL, TRIG, HDL, CHOLHDL, VLDL, LDLCALC  Physical Findings: AIMS: Facial and Oral Movements Muscles of Facial Expression: None, normal Lips and Perioral Area: None, normal Jaw: None, normal Tongue: None, normal,Extremity Movements Upper (arms, wrists, hands, fingers): None, normal Lower (legs, knees, ankles, toes): None, normal, Trunk Movements Neck, shoulders, hips: None, normal, Overall Severity Severity of abnormal movements (highest score from questions above): None, normal Incapacitation due to abnormal movements: None, normal Patient's awareness of abnormal movements (rate only patient's report): No Awareness, Dental Status Current problems with teeth and/or dentures?: No Does patient usually wear dentures?: No  CIWA:    COWS:  COWS Total Score: 0  Musculoskeletal: Strength & Muscle Tone: within normal limits Gait & Station: normal Patient leans: N/A  Psychiatric Specialty Exam: Physical Exam  Constitutional: He is oriented to person, place, and time. He appears well-developed.  HENT:  Head: Normocephalic.  Cardiovascular: Normal rate.  Respiratory: Effort normal.  Genitourinary:    Genitourinary Comments: Deferred   Musculoskeletal:        General: Normal range of motion.     Cervical back: Normal range of motion.     Comments: Rt. Leg amputee.  Neurological: He is alert and oriented to person, place, and time.  Skin: Skin is warm.    Review of Systems  Constitutional: Negative for chills, diaphoresis and fever.  HENT: Negative for  congestion, rhinorrhea and sneezing.   Eyes: Negative for discharge.  Respiratory: Negative for cough, chest tightness, shortness of breath and wheezing.   Cardiovascular: Negative for chest pain and palpitations.  Gastrointestinal: Negative for diarrhea, nausea and vomiting.  Endocrine: Negative for cold intolerance.  Genitourinary: Negative for difficulty urinating.  Musculoskeletal: Negative for arthralgias and myalgias.  Skin: Negative.   Allergic/Immunologic: Negative for environmental allergies and food allergies.       Allergies: NKDA:   Neurological: Negative for dizziness, tremors, seizures, syncope, speech difficulty, weakness, light-headedness, numbness and headaches.  Psychiatric/Behavioral: Positive for dysphoric mood. Negative for agitation, behavioral problems, confusion, decreased concentration, hallucinations, self-injury, sleep disturbance and suicidal ideas. The patient is not nervous/anxious and is not hyperactive.     Blood pressure (!) 109/58, pulse 74, temperature 98.3 F (36.8 C), temperature source Oral, resp. rate 18, height 6\' 2"  (1.88 m), weight 110.7 kg, SpO2 97 %.Body mass index is 31.33 kg/m.  General Appearance: Disheveled  Eye Contact:  Minimal  Speech:  Normal Rate  Volume:  Decreased  Mood: "Improving"  Affect:  Congruent  Thought Process:  Goal Directed and Descriptions of Associations: Intact  Orientation:  Full (Time, Place, and Person)  Thought Content: Denies  Suicidal Thoughts:  No  Homicidal Thoughts:  No  Memory:  Immediate; Fair Recent; Fair Remote;  Fair  Judgement: Fair  Insight:  Lacking  Psychomotor Activity:  Decreased  Concentration:  Concentration: Poor and Attention Span: Poor  Recall:  Poor  Fund of Knowledge:  Poor  Language:  Poor  Akathisia:  Negative  Handed:  Right  AIMS (if indicated):     Assets:  Desire for Improvement Resilience  ADL's:  Intact  Cognition:  WNL    Sleep:  Number of Hours: 5   Treatment Plan  Summary: Daily contact with patient to assess and evaluate symptoms and progress in treatment and Medication management.  - Continue inpatient hospitalization. - Will continue today 05/18/2020 plan as below except where it is noted. Depression.     -  Continue Lexapro 20 mg po daily.  Anxiety/Agitation.     - Continue gabapentin 400 mg po tid.     - Vistaril 25 mg po Q 6 hrs prn.  Mood control.      - Continue Olanzapine Zydis 10 mg po Q hs.  Benzodiazepine withdrawal.     - Continue Librium 25 mg po Q 6 hrs prn.     - Continue Bentyl 20 mg po Q daily for stomach cramps.     - Continue Imodium 2-4 mg po prn for diarrhea.     - Continue Zofran-ODT 4 mg po Q 6 hrs prn for nausea.  Psychosis.     - Continue Olanzapine Zydis 10 mg Q 8 hrs prn.     &     - Lorazepam 1 mg po prn x 1 dose.     &      - Geodon 20 mg IM x once prn. Other medical issues.      - Continue Eliquis 5 mg po bid for blood clot prevention.      - Continue Flexeril 10 mg po tid prn for muscle spasms.      - Continue Metoprolol 25 mg po bid for HTN.      - Continue Naproxen 500 mg po bid prn for pain. Encourage group participation. Discharge disposition plan in progress.  Armandina Stammer, NP, PMHNP, FNP-BC 05/18/2020, 2:40 PM
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
## 2020-05-18 NOTE — Progress Notes (Signed)
   05/18/20 2000  Psych Admission Type (Psych Patients Only)  Admission Status Voluntary  Psychosocial Assessment  Patient Complaints Anxiety;Depression;Sadness;Worrying  Eye Contact Brief  Facial Expression Anxious  Affect Anxious;Depressed;Sad  Speech Logical/coherent  Interaction Assertive  Motor Activity Other (Comment) (WNL)  Appearance/Hygiene Poor hygiene  Behavior Characteristics Cooperative;Anxious  Mood Depressed;Anxious;Sad;Preoccupied  Thought Process  Coherency WDL  Content Blaming others  Delusions None reported or observed  Perception WDL  Hallucination None reported or observed  Judgment Poor  Confusion None  Danger to Self  Current suicidal ideation? Denies  Danger to Others  Danger to Others None reported or observed

## 2020-05-18 NOTE — Progress Notes (Signed)
Patient resting in bed this am.  Patient slept much of the day much more visible in the milieu after lunch. Denies SI, HI, AVH and pain at this time. Denies any withdrawal symptoms Pleasant and cooperative on approach. Compliant with am medications. Denies any issues or concerns this am. Pt remains on Q 15 minutes safety checks and without self harm gestures. Writer encouraged pt to voice concerns. Pt remains safe on unit.

## 2020-05-18 NOTE — Progress Notes (Signed)
   05/18/20 2000  COVID-19 Daily Checkoff  Have you had a fever (temp > 37.80C/100F)  in the past 24 hours?  No  COVID-19 EXPOSURE  Have you traveled outside the state in the past 14 days? No  Have you been in contact with someone with a confirmed diagnosis of COVID-19 or PUI in the past 14 days without wearing appropriate PPE? No  Have you been living in the same home as a person with confirmed diagnosis of COVID-19 or a PUI (household contact)? No  Have you been diagnosed with COVID-19? No
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
## 2020-05-19 ENCOUNTER — Encounter (HOSPITAL_COMMUNITY): Payer: Self-pay

## 2020-05-19 ENCOUNTER — Other Ambulatory Visit: Payer: Self-pay

## 2020-05-19 ENCOUNTER — Emergency Department (HOSPITAL_COMMUNITY)
Admission: EM | Admit: 2020-05-19 | Discharge: 2020-05-20 | Disposition: A | Discharge status: Home / Self Care | Payer: BLUE CROSS/BLUE SHIELD | Attending: Emergency Medicine | Admitting: Emergency Medicine

## 2020-05-19 ENCOUNTER — Emergency Department (HOSPITAL_COMMUNITY): Payer: BLUE CROSS/BLUE SHIELD

## 2020-05-19 DIAGNOSIS — R0789 Other chest pain: Secondary | ICD-10-CM | POA: Insufficient documentation

## 2020-05-19 DIAGNOSIS — F17210 Nicotine dependence, cigarettes, uncomplicated: Secondary | ICD-10-CM | POA: Insufficient documentation

## 2020-05-19 DIAGNOSIS — Z7901 Long term (current) use of anticoagulants: Secondary | ICD-10-CM | POA: Insufficient documentation

## 2020-05-19 DIAGNOSIS — R0602 Shortness of breath: Secondary | ICD-10-CM | POA: Insufficient documentation

## 2020-05-19 DIAGNOSIS — I2699 Other pulmonary embolism without acute cor pulmonale: Secondary | ICD-10-CM | POA: Insufficient documentation

## 2020-05-19 DIAGNOSIS — Z79899 Other long term (current) drug therapy: Secondary | ICD-10-CM | POA: Insufficient documentation

## 2020-05-19 DIAGNOSIS — Z9114 Patient's other noncompliance with medication regimen: Secondary | ICD-10-CM | POA: Insufficient documentation

## 2020-05-19 DIAGNOSIS — Z7982 Long term (current) use of aspirin: Secondary | ICD-10-CM | POA: Insufficient documentation

## 2020-05-19 MED ORDER — OLANZAPINE 10 MG PO TBDP: 10.0000 mg | ORAL_TABLET | Freq: Every day | ORAL | 0 refills | Status: DC

## 2020-05-19 MED ORDER — CYCLOBENZAPRINE HCL 10 MG PO TABS: 10.0000 mg | ORAL_TABLET | Freq: Three times a day (TID) | ORAL | 0 refills | Status: DC | PRN

## 2020-05-19 MED ORDER — GABAPENTIN 400 MG PO CAPS: 400.0000 mg | ORAL_CAPSULE | Freq: Three times a day (TID) | ORAL | 1 refills | Status: DC

## 2020-05-19 MED ORDER — ESCITALOPRAM OXALATE 20 MG PO TABS: 20.0000 mg | ORAL_TABLET | Freq: Every day | ORAL | 0 refills | Status: DC

## 2020-05-19 MED ORDER — HYDROXYZINE HCL 25 MG PO TABS: 25.0000 mg | ORAL_TABLET | Freq: Four times a day (QID) | ORAL | 0 refills | Status: DC | PRN

## 2020-05-19 MED ORDER — SODIUM CHLORIDE 0.9% FLUSH
3.0000 mL | Freq: Once | INTRAVENOUS | Status: AC
  Administered 2020-05-20: 3 mL via INTRAVENOUS

## 2020-05-19 MED ORDER — METOPROLOL TARTRATE 25 MG PO TABS: 25.0000 mg | ORAL_TABLET | Freq: Two times a day (BID) | ORAL | 0 refills | Status: DC

## 2020-05-19 NOTE — Progress Notes (Signed)
  [facility] Adult Case Management Discharge Plan :  Will you be returning to the same living situation after discharge:  Yes,  Patient is homeless and has been provided with list of local shelter resource information. At discharge, do you have transportation home?: Yes,  Patient has been provided with bus passes. Do you have the ability to pay for your medications: No.  Release of information consent forms completed and in the chart;  Patient's signature needed at discharge.  Patient to Follow up at:   Next level of care provider has access to [facility] Link:no  Safety Planning and Suicide Prevention discussed: Yes,  Patient was provided with suicide prevention education.     Has patient been referred to the Quitline?: Patient refused referral  Patient has been referred for addiction treatment: Patient expressed no interest in drug treatment at this time.  Evorn Gong, LCSW 05/19/2020, 10:32 AM

## 2020-05-19 NOTE — [facility] Group Notes (Signed)
[facility] LCSW Group Therapy Note  Date/Time:  05/19/2020 1015  Type of Therapy and Topic:  Group Therapy:  Healthy and Unhealthy Supports  Participation Level:  None   Description of Group:  Patients in this group were introduced to the idea of adding a variety of healthy supports to address the various needs in their lives.Patients discussed what additional healthy supports could be helpful in their recovery and wellness after discharge in order to prevent future hospitalizations.   An emphasis was placed on using counselor, doctor, therapy groups, 12-step groups, and problem-specific support groups to expand supports.  They also worked as a group on developing a specific plan for several patients to deal with unhealthy supports through boundary-setting, psychoeducation with loved ones, and even termination of relationships.   Therapeutic Goals:   1)  discuss importance of adding supports to stay well once out of the hospital  2)  compare healthy versus unhealthy supports and identify some examples of each  3)  generate ideas and descriptions of healthy supports that can be added  4)  offer mutual support about how to address unhealthy supports  5)  encourage active participation in and adherence to discharge plan    Summary of Patient Progress:  The patient proved reluctant to engage in introductory check-in, sharing of being "0/10; I don't know." Pt did not prove to engage any further, remaining unengaged throughout duration. Pt minimally acknowledged CSW feedback.   Therapeutic Modalities:   Motivational Interviewing Brief Solution-Focused Therapy  Micheline Maze 05/19/2020  12:53 PM

## 2020-05-19 NOTE — [facility] Group Notes (Signed)
[facility] Group Notes:  (Nursing/MHT/Case Management/Adjunct)  Date:  05/19/2020  Time:  1:23 AM  Type of Therapy:  Group Therapy  Participation Level:  Active  Participation Quality:  Appropriate  Affect:  Appropriate  Cognitive:  Appropriate  Insight:  Appropriate  Engagement in Group:  Engaged  Modes of Intervention:  Discussion  Summary of Progress/Problems: pt was attentive and appropriate during tonight's wrap up group. Pt shared that he need assistance with obtaining housing, disability and other services so that he can take better care of hisself. Pt shared that he will continue to work on being depressed and voices. Pt shared that its good to be around other people so that he want feel alone.   Bing Plume D 05/19/2020, 1:23 AM

## 2020-05-19 NOTE — Progress Notes (Addendum)
Pt discharged to lobby.  Pt was stable and appreciative at that time. All papers, prescriptions, bus passes, and med samples were given. Medications reviewed and pt provided with pamphlet of resources. Verbal understanding expressed. Because patient had 4 lockers full of belongings, and he would be taking bus, pt decided to leave most belongings in search room behind curtain. Pt requesting to pick them up tomorrow. Belongings left behind curtain include: large blue plastic bin (with camo tarp and camo jacket on top) On the second shelf, pt has 3 bags along with his leg prosthesis..Pt denies SI/HI and A/VH. Pt given opportunity to express concerns and ask questions.

## 2020-05-19 NOTE — [facility] Suicide Risk Assessment (Signed)
[facility] INPATIENT:  Family/Significant Other Suicide Prevention Education  Suicide Prevention Education:  Contact Attempts: April Kram, patient's wife,   has been identified by the patient as the family member/significant other with whom the patient will be residing, and identified as the person(s) who will aid the patient in the event of a mental health crisis.  With written consent from the patient, two attempts were made to provide suicide prevention education, prior to and/or following the patient's discharge.  We were unsuccessful in providing suicide prevention education.  A suicide education pamphlet was given to the patient to share with family/significant other.  Date and time of first attempt:849/05/19/21 Date and time of second attempt:1045/05/19/21  Evorn Gong 05/19/2020, 10:45 AM

## 2020-05-19 NOTE — ED Triage Notes (Signed)
Pt reports central chest pain and SOB that started about 13 hours ago. Endorses some nausea. He denies diarrhea or vomiting. States that he wasn't doing anything when the pain started.
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
## 2020-05-19 NOTE — Progress Notes (Signed)
D. Pt is calm and cooperative, compliant with meds. Pt currently denies SI/HI and AVH A. Labs and vitals monitored.  Pt supported emotionally and encouraged to express concerns and ask questions.   R. Pt remains safe with 15 minute checks. Will continue POC.

## 2020-05-19 NOTE — [facility] Suicide Risk Assessment (Signed)
[facility] Discharge Suicide Risk Assessment   Principal Problem: Amphetamine-induced psychotic disorder [facility]) Discharge Diagnoses: Principal Problem:   Amphetamine-induced psychotic disorder (HCC) Active Problems:   MDD (major depressive disorder), recurrent episode, severe (HCC)   Depression   MDD (major depressive disorder)   Total Time spent with patient: 15 minutes  Musculoskeletal: Strength & Muscle Tone: within normal limits Gait & Station: leg prosthesis Patient leans: N/A  Psychiatric Specialty Exam: Review of Systems  All other systems reviewed and are negative.   Blood pressure 103/77, pulse 64, temperature 98.1 F (36.7 C), temperature source Oral, resp. rate 20, height 6\' 2"  (1.88 m), weight 110.7 kg, SpO2 99 %.Body mass index is 31.33 kg/m.  General Appearance: Casual  Eye Contact::  Fair  Speech:  Normal Rate409  Volume:  Normal  Mood:  Euthymic  Affect:  Congruent  Thought Process:  Coherent and Descriptions of Associations: Circumstantial  Orientation:  Full (Time, Place, and Person)  Thought Content:  Logical  Suicidal Thoughts:  No  Homicidal Thoughts:  No  Memory:  Immediate;   Fair Recent;   Fair Remote;   Fair  Judgement:  Impaired  Insight:  Lacking  Psychomotor Activity:  Normal  Concentration:  Fair  Recall:  Good  Fund of Knowledge:Fair  Language: Fair  Akathisia:  Negative  Handed:  Right  AIMS (if indicated):     Assets:  Desire for Improvement Resilience  Sleep:  Number of Hours: 4.25  Cognition: WNL  ADL's:  Intact   Mental Status Per Nursing Assessment::   On Admission:  Suicidal ideation indicated by patient, Self-harm thoughts, Self-harm behaviors  Demographic Factors:  Male, Divorced or widowed, Caucasian, Low socioeconomic status, Living alone and Unemployed  Loss Factors: NA  Historical Factors: Impulsivity  Risk Reduction Factors:   NA  Continued Clinical Symptoms:  Alcohol/Substance Abuse/Dependencies  Cognitive  Features That Contribute To Risk:  None    Suicide Risk:  Minimal: No identifiable suicidal ideation.  Patients presenting with no risk factors but with morbid ruminations; may be classified as minimal risk based on the severity of the depressive symptoms    Plan Of Care/Follow-up recommendations:  Activity:  ad lib  [phone], MD 05/19/2020, 9:11 AM

## 2020-05-20 ENCOUNTER — Emergency Department (HOSPITAL_COMMUNITY): Payer: BLUE CROSS/BLUE SHIELD

## 2020-05-20 ENCOUNTER — Encounter (HOSPITAL_COMMUNITY): Payer: Self-pay

## 2020-05-20 LAB — CBC
HCT: 42.4 % (ref 39.0–52.0)
Hemoglobin: 13.9 g/dL (ref 13.0–17.0)
MCH: 29.4 pg (ref 26.0–34.0)
MCHC: 32.8 g/dL (ref 30.0–36.0)
MCV: 89.6 fL (ref 80.0–100.0)
Platelets: 320 10*3/uL (ref 150–400)
RBC: 4.73 MIL/uL (ref 4.22–5.81)
RDW: 14.1 % (ref 11.5–15.5)
WBC: 26.1 10*3/uL — ABNORMAL HIGH (ref 4.0–10.5)
nRBC: 0 % (ref 0.0–0.2)

## 2020-05-20 LAB — BASIC METABOLIC PANEL
Anion gap: 10 (ref 5–15)
BUN: 12 mg/dL (ref 6–20)
CO2: 26 mmol/L (ref 22–32)
Calcium: 8.7 mg/dL — ABNORMAL LOW (ref 8.9–10.3)
Chloride: 104 mmol/L (ref 98–111)
Creatinine, Ser: 0.64 mg/dL (ref 0.61–1.24)
GFR calc Af Amer: >60 mL/min (ref >60)
GFR calc non Af Amer: >60 mL/min (ref >60)
Glucose, Bld: 106 mg/dL — ABNORMAL HIGH (ref 70–99)
Potassium: 4 mmol/L (ref 3.5–5.1)
Sodium: 140 mmol/L (ref 135–145)

## 2020-05-20 LAB — TROPONIN I (HIGH SENSITIVITY)
Troponin I (High Sensitivity): 3 ng/L (ref <18)
Troponin I (High Sensitivity): 3 ng/L (ref <18)

## 2020-05-20 MED ORDER — SODIUM CHLORIDE (PF) 0.9 % IJ SOLN
INTRAMUSCULAR | Status: AC
  Filled 2020-05-20: qty 50

## 2020-05-20 MED ORDER — IOHEXOL 350 MG/ML SOLN
100.0000 mL | Freq: Once | INTRAVENOUS | Status: AC | PRN
  Administered 2020-05-20: 100 mL via INTRAVENOUS

## 2020-05-20 MED ORDER — SODIUM CHLORIDE 0.9 % IV BOLUS
1000.0000 mL | Freq: Once | INTRAVENOUS | Status: AC
  Administered 2020-05-20: 1000 mL via INTRAVENOUS

## 2020-05-20 NOTE — Discharge Instructions (Addendum)
Continue taking your home medications. Follow-up with your primary care provider. Return to the ER for worsening chest pain, shortness of breath, leg swelling.

## 2020-05-20 NOTE — ED Provider Notes (Signed)
[facility]-EMERGENCY DEPT Provider Note   CSN: [id number] Arrival date & time: 05/19/20  2327     History Chief Complaint  Patient presents with   Chest Pain    Michael Farrell. is a 49 y.o. male with a past medical history of substance abuse, depression, prior PE cannot afford Eliquis for the past 3 to 4 months presenting to the ED with a chief complaint of chest pain.  Reports a 12-hour history of intermittent central chest pain and shortness of breath.  He was sitting on his couch when symptoms began, states he was not exerting himself.  He was diagnosed with PEs he believes in 2018 and then again in 2019.  He denies any abdominal pain, nausea, vomiting, cough, fever, sick contacts with similar symptoms, injuries or falls.  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   Amphetamine-induced psychotic disorder (HCC) 05/01/2020   Moderate benzodiazepine use disorder (HCC) 05/01/2020    Past Surgical History:  Procedure Laterality Date   CLAVICLE SURGERY     pt unsure whether it was left or right; has been broken "a couple times"   LEG AMPUTATION         Family History  Family history unknown: Yes    Social History   Tobacco Use   Smoking status: Current Every Day Smoker    Packs/day: 1.00    Types: Cigarettes   Smokeless tobacco: Former Neurosurgeon  Substance Use Topics   Alcohol use: Not Currently   Drug use: Yes    Types: Marijuana    Home Medications Prior to Admission medications   Medication Sig Start Date End Date Taking? Authorizing Provider  apixaban (ELIQUIS) 5 MG TABS tablet Take 5 mg by mouth 2 (two) times daily.     [provider]  aspirin EC 81 MG tablet Take 81 mg by mouth daily.    [provider]  cyclobenzaprine (FLEXERIL) 10 MG tablet Take 1 tablet (10 mg total) by mouth 3 (three) times daily as needed for muscle spasms. 05/19/20   Armandina Stammer I, NP  escitalopram (LEXAPRO) 20 MG tablet Take 1 tablet (20 mg total) by mouth daily. For depression 05/20/20   Armandina Stammer I, NP  gabapentin (NEURONTIN) 400 MG capsule Take 1 capsule (400 mg total) by mouth 3 (three) times daily. For agitation 05/19/20   Armandina Stammer I, NP  hydrOXYzine (ATARAX/VISTARIL) 25 MG tablet Take 1 tablet (25 mg total) by mouth every 6 (six) hours as needed for anxiety. 05/19/20   Armandina Stammer I, NP  metoprolol tartrate (LOPRESSOR) 25 MG tablet Take 1 tablet (25 mg total) by mouth 2 (two) times daily. For high blood pressure 05/19/20   Nwoko, Nicole Kindred I, NP  OLANZapine zydis (ZYPREXA) 10 MG disintegrating tablet Take 1 tablet (10 mg total) by mouth at bedtime. For mood control 05/19/20   Sanjuana Kava, NP    Allergies    Patient has no known allergies.  Review of Systems   Review of Systems  Constitutional: Negative for appetite change, chills and fever.  HENT: Negative for ear pain, rhinorrhea, sneezing and sore throat.   Eyes: Negative for  photophobia and visual disturbance.  Respiratory: Positive for shortness of breath. Negative for cough, chest tightness and wheezing.   Cardiovascular: Positive for chest pain. Negative for palpitations.  Gastrointestinal: Negative for abdominal pain, blood in stool, constipation, diarrhea, nausea and vomiting.  Genitourinary: Negative for dysuria, hematuria and urgency.  Musculoskeletal: Negative for myalgias.  Skin: Negative for rash.  Neurological: Negative for dizziness, weakness and light-headedness.    Physical Exam Updated Vital Signs BP 94/60    Pulse 75    Temp 98 F (36.7 C) (Oral)    Resp 14    Ht 6\' 2"  (1.88 m)    Wt 104.3 kg    SpO2 95%    BMI 29.53 kg/m   Physical  Exam Vitals and nursing note reviewed.  Constitutional:      General: He is not in acute distress.    Appearance: He is well-developed.  HENT:     Head: Normocephalic and atraumatic.     Nose: Nose normal.  Eyes:     General: No scleral icterus.       Left eye: No discharge.     Conjunctiva/sclera: Conjunctivae normal.  Cardiovascular:     Rate and Rhythm: Normal rate and regular rhythm.     Heart sounds: Normal heart sounds. No murmur. No friction rub. No gallop.   Pulmonary:     Effort: Pulmonary effort is normal. No respiratory distress.     Breath sounds: Normal breath sounds.  Abdominal:     General: Bowel sounds are normal. There is no distension.     Palpations: Abdomen is soft.     Tenderness: There is no abdominal tenderness. There is no guarding.  Musculoskeletal:        General: Normal range of motion.     Cervical back: Normal range of motion and neck supple.  Skin:    General: Skin is warm and dry.     Findings: No rash.  Neurological:     Mental Status: He is alert.     Motor: No abnormal muscle tone.     Coordination: Coordination normal.     ED Results / Procedures / Treatments   Labs (all labs ordered are listed, but only abnormal results are displayed) Labs Reviewed  BASIC METABOLIC PANEL - Abnormal; Notable for the following components:      Result Value   Glucose, Bld 106 (*)    Calcium 8.7 (*)    All other components within normal limits  CBC - Abnormal; Notable for the following components:   WBC 26.1 (*)    All other components within normal limits  TROPONIN I (HIGH SENSITIVITY)  TROPONIN I (HIGH SENSITIVITY)    EKG EKG Interpretation  Date/Time:  Monday May 20 2020 00:16:24 EDT Ventricular Rate:  88 PR Interval:    QRS Duration: 93 QT Interval:  355 QTC Calculation: 430 R Axis:   60 Text Interpretation: Sinus rhythm No acute changes Confirmed by 02-15-1969 [phone]) on 05/20/2020 12:27:09 AM   Radiology DG Chest 2  View  Result Date: 05/20/2020 CLINICAL DATA:  Chest pain and shortness of breath. EXAM: CHEST - 2 VIEW COMPARISON:  None. FINDINGS: The cardiomediastinal contours are normal. The lungs are clear. Pulmonary vasculature is normal. No consolidation, pleural effusion, or pneumothorax. No acute osseous abnormalities are seen. IMPRESSION: Negative radiographs of the chest. Electronically Signed   By: 07/20/2020 M.D.   On: 05/20/2020 00:31   CT Angio Chest PE W/Cm &/Or Wo Cm  Result  Date: 05/20/2020 CLINICAL DATA:  Chest pain and shortness of breath. History of pulmonary embolus. Not on anticoagulation. EXAM: CT ANGIOGRAPHY CHEST WITH CONTRAST TECHNIQUE: Multidetector CT imaging of the chest was performed using the standard protocol during bolus administration of intravenous contrast. Multiplanar CT image reconstructions and MIPs were obtained to evaluate the vascular anatomy. CONTRAST:  125mL OMNIPAQUE IOHEXOL 350 MG/ML SOLN COMPARISON:  Radiograph earlier this day. FINDINGS: Cardiovascular: There are no filling defects within the pulmonary arteries to suggest pulmonary embolus. No evidence of acute or chronic pulmonary embolus. Main pulmonary artery is normal in caliber. Cannot assess for dissection given phase of contrast tailored to pulmonary artery evaluation. There is no aortic aneurysm. Heart is normal in size. No pericardial effusion. Mediastinum/Nodes: No enlarged mediastinal or hilar lymph nodes. No visualized thyroid nodule. No esophageal wall thickening. Lungs/Pleura: Heterogeneous pulmonary parenchyma suggesting small airways disease. Mild to moderate paraseptal and central lobular emphysema. Calcified granuloma in the right upper lobe. No confluent airspace disease. No septal thickening/pulmonary edema. No pleural effusion. No pulmonary mass. Upper Abdomen: Negative. Musculoskeletal: There are no acute or suspicious osseous abnormalities. Review of the MIP images confirms the above findings.  IMPRESSION: 1. No pulmonary embolus. 2. Mild emphysema. Heterogeneous pulmonary parenchyma suggesting small airways disease. Emphysema (ICD10-J43.9). Electronically Signed   By: Keith Rake M.D.   On: 05/20/2020 02:10    Procedures Procedures (including critical care time)  Medications Ordered in ED Medications  sodium chloride flush (NS) 0.9 % injection 3 mL (3 mLs Intravenous Given 05/20/20 0019)  iohexol (OMNIPAQUE) 350 MG/ML injection 100 mL (100 mLs Intravenous Contrast Given 05/20/20 0126)  sodium chloride (PF) 0.9 % injection (  Given by Other 05/20/20 0129)  sodium chloride 0.9 % bolus 1,000 mL (1,000 mLs Intravenous New Bag/Given 05/20/20 0244)    ED Course  I have reviewed the triage vital signs and the nursing notes.  Pertinent labs & imaging results that were available during my care of the patient were reviewed by me and considered in my medical decision making (see chart for details).    MDM Rules/Calculators/A&P                      49 year old male with a past medical history of substance abuse, depression, prior PE not currently on Eliquis due to financial constraint presenting to the ED with a chief complaint of chest pain and shortness of breath for the past 12 hours.  Symptoms have been intermittent.  On exam lungs are clear to auscultation bilaterally.  Oxygen saturation 95% on room air.  He is not tachycardic or tachypneic.  However he is high risk for PE due to his history and lack of anticoagulants.  EKG shows sinus rhythm, no changes from prior tracings.  Chest x-ray is unremarkable.  BMP, initial dose troponin negative.  CBC with leukocytosis of 26 which could be reactive to symptoms.  CT angio of the chest shows no evidence of PE, does show chronic small airway disease.  Patient resting comfortably on exam, doubt ACS or other emergent cause of his symptoms. Will have him follow-up with PCP and return for worsening symptoms.  All imaging, if done today, including plain  films, CT scans, and ultrasounds, independently reviewed by me, and interpretations confirmed via formal radiology reads.  Patient is hemodynamically stable, in NAD, and able to ambulate in the ED. Evaluation does not show pathology that would require ongoing emergent intervention or inpatient treatment. I explained the diagnosis to the  patient. Pain has been managed and has no complaints prior to discharge. Patient is comfortable with above plan and is stable for discharge at this time. All questions were answered prior to disposition. Strict return precautions for returning to the ED were discussed. Encouraged follow up with PCP.   An After Visit Summary was printed and given to the patient.   Portions of this note were generated with Scientist, clinical (histocompatibility and immunogenetics). Dictation errors may occur despite best attempts at proofreading.  Final Clinical Impression(s) / ED Diagnoses Final diagnoses:  Chest wall pain    Rx / DC Orders ED Discharge Orders    None       Dietrich Pates, PA-C 05/20/20 0255    Nira Conn, MD 05/20/20 [phone]

## 2020-05-20 NOTE — ED Provider Notes (Signed)
Attestation: Medical screening examination/treatment/procedure(s) were conducted as a shared visit with non-physician practitioner(s) and myself.  I personally evaluated the patient during the encounter.   Briefly, the patient is a 49 y.o. male with h/o substance abuse, depression, prior PE cannot afford Eliquis for the past 3 to 4 months presenting to the ED with a chief complaint of chest pain.   Vitals:   05/20/20 0200 05/20/20 0246  BP: (!) 89/70 94/60  Pulse: 74 75  Resp: 11 14  Temp:    SpO2: 95% 95%    CONSTITUTIONAL:  well-appearing, NAD NEURO:  Alert and oriented x 3, no focal deficits EYES:  pupils equal and reactive ENT/NECK:  trachea midline, no JVD CARDIO:  reg rate, reg rhythm, well-perfused PULM:  None labored breathing GI/GU:  Abdomin non-distended MSK/SPINE:  Right BKA SKIN:  no rash, atraumatic PSYCH:  Appropriate speech and behavior   EKG Interpretation  Date/Time:  Monday May 20 2020 00:16:24 EDT Ventricular Rate:  88 PR Interval:    QRS Duration: 93 QT Interval:  355 QTC Calculation: 430 R Axis:   60 Text Interpretation: Sinus rhythm No acute changes Confirmed by Drema Pry [phone]) on 05/20/2020 12:27:09 AM       Work up w/o PE. EKG w/o acute changes and negative trops. Doubt dissection. The patient appears reasonably screened and/or stabilized for discharge and I doubt any other medical condition or other [facility] requiring further screening, evaluation, or treatment in the ED at this time prior to discharge. Safe for discharge with strict return precautions. Nira Conn, MD 05/20/20 [phone]

## 2020-05-26 ENCOUNTER — Other Ambulatory Visit: Payer: Self-pay

## 2020-05-26 ENCOUNTER — Observation Stay (HOSPITAL_COMMUNITY)
Admission: RE | Admit: 2020-05-26 | Discharge: 2020-05-26 | Disposition: A | Discharge status: Home / Self Care | Payer: Medicaid Other | Attending: Psychiatry | Admitting: Psychiatry

## 2020-05-26 ENCOUNTER — Encounter (HOSPITAL_COMMUNITY): Payer: Self-pay | Admitting: Nurse Practitioner

## 2020-05-26 DIAGNOSIS — Z89611 Acquired absence of right leg above knee: Secondary | ICD-10-CM | POA: Insufficient documentation

## 2020-05-26 DIAGNOSIS — R45851 Suicidal ideations: Secondary | ICD-10-CM | POA: Insufficient documentation

## 2020-05-26 DIAGNOSIS — F15951 Other stimulant use, unspecified with stimulant-induced psychotic disorder with hallucinations: Secondary | ICD-10-CM

## 2020-05-26 DIAGNOSIS — Z7982 Long term (current) use of aspirin: Secondary | ICD-10-CM | POA: Diagnosis not present

## 2020-05-26 DIAGNOSIS — F333 Major depressive disorder, recurrent, severe with psychotic symptoms: Secondary | ICD-10-CM | POA: Diagnosis present

## 2020-05-26 DIAGNOSIS — Z590 Homelessness: Secondary | ICD-10-CM | POA: Diagnosis not present

## 2020-05-26 DIAGNOSIS — Z7901 Long term (current) use of anticoagulants: Secondary | ICD-10-CM | POA: Diagnosis not present

## 2020-05-26 DIAGNOSIS — Z86711 Personal history of pulmonary embolism: Secondary | ICD-10-CM | POA: Diagnosis not present

## 2020-05-26 DIAGNOSIS — F1510 Other stimulant abuse, uncomplicated: Secondary | ICD-10-CM | POA: Diagnosis present

## 2020-05-26 DIAGNOSIS — Z79899 Other long term (current) drug therapy: Secondary | ICD-10-CM | POA: Insufficient documentation

## 2020-05-26 DIAGNOSIS — Z20822 Contact with and (suspected) exposure to covid-19: Secondary | ICD-10-CM | POA: Insufficient documentation

## 2020-05-26 DIAGNOSIS — F15959 Other stimulant use, unspecified with stimulant-induced psychotic disorder, unspecified: Secondary | ICD-10-CM | POA: Diagnosis present

## 2020-05-26 DIAGNOSIS — F15159 Other stimulant abuse with stimulant-induced psychotic disorder, unspecified: Principal | ICD-10-CM | POA: Insufficient documentation

## 2020-05-26 DIAGNOSIS — F419 Anxiety disorder, unspecified: Secondary | ICD-10-CM | POA: Diagnosis not present

## 2020-05-26 LAB — SARS CORONAVIRUS 2 BY RT PCR (HOSPITAL ORDER, PERFORMED IN ~~LOC~~ HOSPITAL LAB): SARS Coronavirus 2: NEGATIVE

## 2020-05-26 MED ORDER — CYCLOBENZAPRINE HCL 5 MG PO TABS: 10.0000 mg | ORAL_TABLET | Freq: Three times a day (TID) | ORAL | Status: DC | PRN

## 2020-05-26 MED ORDER — OLANZAPINE 10 MG PO TBDP
10.0000 mg | ORAL_TABLET | Freq: Every day | ORAL | Status: DC
  Administered 2020-05-26: 10 mg via ORAL
  Filled 2020-05-26: qty 1

## 2020-05-26 MED ORDER — ASPIRIN EC 81 MG PO TBEC
81.0000 mg | DELAYED_RELEASE_TABLET | Freq: Every day | ORAL | Status: DC
  Administered 2020-05-26: 81 mg via ORAL
  Filled 2020-05-26: qty 1

## 2020-05-26 MED ORDER — ESCITALOPRAM OXALATE 10 MG PO TABS
20.0000 mg | ORAL_TABLET | Freq: Every day | ORAL | Status: DC
  Administered 2020-05-26: 20 mg via ORAL
  Filled 2020-05-26: qty 2

## 2020-05-26 MED ORDER — HYDROXYZINE HCL 25 MG PO TABS
25.0000 mg | ORAL_TABLET | Freq: Four times a day (QID) | ORAL | Status: DC | PRN
  Administered 2020-05-26: 25 mg via ORAL
  Filled 2020-05-26: qty 1

## 2020-05-26 MED ORDER — MAGNESIUM HYDROXIDE 400 MG/5ML PO SUSP: 30.0000 mL | Freq: Every day | ORAL | Status: DC | PRN

## 2020-05-26 MED ORDER — METOPROLOL TARTRATE 25 MG PO TABS
25.0000 mg | ORAL_TABLET | Freq: Two times a day (BID) | ORAL | Status: DC
  Administered 2020-05-26: 25 mg via ORAL
  Filled 2020-05-26: qty 1

## 2020-05-26 MED ORDER — ALUM & MAG HYDROXIDE-SIMETH 200-200-20 MG/5ML PO SUSP: 30.0000 mL | ORAL | Status: DC | PRN

## 2020-05-26 MED ORDER — ACETAMINOPHEN 325 MG PO TABS: 650.0000 mg | ORAL_TABLET | Freq: Four times a day (QID) | ORAL | Status: DC | PRN

## 2020-05-26 MED ORDER — GABAPENTIN 400 MG PO CAPS
400.0000 mg | ORAL_CAPSULE | Freq: Three times a day (TID) | ORAL | Status: DC
  Administered 2020-05-26 (×2): 400 mg via ORAL
  Filled 2020-05-26 (×2): qty 1

## 2020-05-26 MED ORDER — APIXABAN 5 MG PO TABS
5.0000 mg | ORAL_TABLET | Freq: Two times a day (BID) | ORAL | Status: DC
  Administered 2020-05-26: 5 mg via ORAL
  Filled 2020-05-26 (×5): qty 1

## 2020-05-26 NOTE — Progress Notes (Signed)
Patient ID: Michael Farrell., male   DOB: 1971/08/19, 49 y.o.   MRN: [id number] Pt A&O x 4, presents with complaint of hearing voices, saying I'm going to kill you.  Pt paranoid, stating people were following him at [facility].  Previous SI attempts noted,  Denies HI.  Pt has rt AKA, well healed. Monitoring for safety, Skin search completed.

## 2020-05-26 NOTE — Progress Notes (Signed)
Patient ID: Michael Whitner., male   DOB: 1971-05-31, 49 y.o.   MRN: [id number]  D: Pt alert and oriented on the unit.   A: Education, support, and encouragement provided. Discharge summary, medications and follow up appointments reviewed with pt. Suicide prevention resources provided. Pt's belongings in lockers # 1, 4, 5 returned.   R: Pt denies SI/HI, A/VH or any concerns at this time. Pt ambulatory on and off unit with assistance of his personal wheelchair and prosthetic leg. Pt discharged to lobby.

## 2020-05-26 NOTE — Discharge Instructions (Signed)
[facility] in [place], [place] Address: [address], [place], [place] [address] Hours:  Closed ? Opens 6AM Mon Phone: [phone]   

## 2020-05-26 NOTE — Plan of Care (Signed)
[facility] Observation Crisis Plan  Reason for Crisis Plan:  Crisis Stabilization   Plan of Care:  Referral for Inpatient Hospitalization  Family Support:      Current Living Environment:  Living Arrangements: Other (Comment) (living in a tent)  Insurance:   Hospital Account    Name Acct ID Class Status Primary Coverage   Trayveon, [place]ford [id number] BEHAVIORAL HEALTH OBSERVATION Open [facility] FOR MH/DD/SAS - 3-WAY [place]        Guarantor Account (for Hospital Account [id number])    Name Relation to Pt Service Area Active? Acct Type   [name]. Self [facility] Yes Behavioral Health   Address Phone       [place], [place] [address] [phone](H)          Coverage Information (for Hospital Account [id number])    F/O Payor/Plan Precert #   [facility] FOR MH/DD/SAS/3-WAY [facility]    Subscriber Subscriber #   Marsha, Hillman [id number]   Address Phone   [address] [place], [place] [address] [phone]      Legal Guardian:  Legal Guardian: Other: (Self)  Primary Care Provider:  Lavinia Sharps, NP  Current Outpatient Providers:  Lavinia Sharps, NP  Psychiatrist:  Name of Psychiatrist: None  Counselor/Therapist:  Name of Therapist: None  Compliant with Medications:  No  Additional Information:   Tasia Catchings 6/13/20213:25 AM

## 2020-05-26 NOTE — [facility] Suicide Risk Assessment (Signed)
Suicide Risk Assessment    [facility] Discharge Suicide Risk Assessment   Principal Problem: Amphetamine-induced psychotic disorder [facility]) Discharge Diagnoses: Principal Problem:   Amphetamine-induced psychotic disorder (HCC) Active Problems:   Amphetamine abuse (HCC)   Total Time spent with patient: 45 minutes   Musculoskeletal: Strength & Muscle Tone: within normal limits Gait & Station: normal Patient leans: N/A  Psychiatric Specialty Exam: Physical Exam  Nursing note and vitals reviewed. Constitutional: He is oriented to person, place, and time. He is cooperative.  HENT:  Head: Normocephalic.  Cardiovascular: Normal pulses.  GI: Normal appearance.  Musculoskeletal:        General: Normal range of motion.  Neurological: He is alert and oriented to person, place, and time.  Psychiatric: His speech is normal and behavior is normal. Memory, affect, judgment and thought content normal. His mood appears anxious.    Review of Systems  Psychiatric/Behavioral: The patient is nervous/anxious.   All other systems reviewed and are negative.   Blood pressure 111/71, pulse 87, temperature 98.6 F (37 C), temperature source Oral, resp. rate 16, SpO2 98 %.There is no height or weight on file to calculate BMI.  General Appearance: Disheveled  Eye Contact:  Good  Speech:  Normal Rate  Volume:  Normal  Mood:  Anxious  Affect:  Congruent  Thought Process:  Coherent and Descriptions of Associations: Intact  Orientation:  Full (Time, Place, and Person)  Thought Content:  WDL and Logical  Suicidal Thoughts:  No  Homicidal Thoughts:  No  Memory:  Immediate;   Good Recent;   Good Remote;   Good  Judgement:  Fair  Insight:  Fair  Psychomotor Activity:  Normal  Concentration:  Concentration: Good and Attention Span: Good  Recall:  Good  Fund of Knowledge:  Fair  Language:  Good  Akathisia:  No  Handed:  Right  AIMS (if indicated):     Assets:  Leisure Time Physical Health Resilience   ADL's:  Intact  Cognition:  WNL  Sleep:       Mental Status Per Nursing Assessment::   On Admission:  Suicidal ideation indicated by patient  Demographic Factors:  Male and Caucasian  Loss Factors: NA  Historical Factors: NA  Risk Reduction Factors:   Sense of responsibility to family and Positive social support  Continued Clinical Symptoms:  Anxiety, mild  Cognitive Features That Contribute To Risk:  None    Suicide Risk:  Minimal: No identifiable suicidal ideation.  Patients presenting with no risk factors but with morbid ruminations; may be classified as minimal risk based on the severity of the depressive symptoms   Plan Of Care/Follow-up recommendations:  Methamphetamine induced psychosis: -Continued Zyprexa 10 mg at bedtime -Continued gabapentin 400 mg TID  Depression: -Continued Lexapro 20 mg daily Activity:  as tolerated Diet:  heart healthy diet  Nanine Means, NP 05/26/2020, 1:38 PM
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
## 2020-05-27 ENCOUNTER — Emergency Department (HOSPITAL_COMMUNITY)
Admission: EM | Admit: 2020-05-27 | Discharge: 2020-05-27 | Disposition: A | Discharge status: Home / Self Care | Payer: BLUE CROSS/BLUE SHIELD | Attending: Emergency Medicine | Admitting: Emergency Medicine

## 2020-05-27 ENCOUNTER — Encounter (HOSPITAL_COMMUNITY): Payer: Self-pay

## 2020-05-27 DIAGNOSIS — F332 Major depressive disorder, recurrent severe without psychotic features: Secondary | ICD-10-CM | POA: Insufficient documentation

## 2020-05-27 DIAGNOSIS — F19959 Other psychoactive substance use, unspecified with psychoactive substance-induced psychotic disorder, unspecified: Secondary | ICD-10-CM

## 2020-05-27 DIAGNOSIS — R45851 Suicidal ideations: Secondary | ICD-10-CM | POA: Diagnosis not present

## 2020-05-27 DIAGNOSIS — F17210 Nicotine dependence, cigarettes, uncomplicated: Secondary | ICD-10-CM | POA: Insufficient documentation

## 2020-05-27 DIAGNOSIS — Z7982 Long term (current) use of aspirin: Secondary | ICD-10-CM | POA: Insufficient documentation

## 2020-05-27 DIAGNOSIS — R440 Auditory hallucinations: Secondary | ICD-10-CM | POA: Diagnosis not present

## 2020-05-27 DIAGNOSIS — Z79899 Other long term (current) drug therapy: Secondary | ICD-10-CM | POA: Insufficient documentation

## 2020-05-27 DIAGNOSIS — F1520 Other stimulant dependence, uncomplicated: Secondary | ICD-10-CM

## 2020-05-27 LAB — CBC
HCT: 42.3 % (ref 39.0–52.0)
Hemoglobin: 13.4 g/dL (ref 13.0–17.0)
MCH: 28.6 pg (ref 26.0–34.0)
MCHC: 31.7 g/dL (ref 30.0–36.0)
MCV: 90.4 fL (ref 80.0–100.0)
Platelets: 306 10*3/uL (ref 150–400)
RBC: 4.68 MIL/uL (ref 4.22–5.81)
RDW: 15.5 % (ref 11.5–15.5)
WBC: 14.8 10*3/uL — ABNORMAL HIGH (ref 4.0–10.5)
nRBC: 0 % (ref 0.0–0.2)

## 2020-05-27 LAB — COMPREHENSIVE METABOLIC PANEL
ALT: 32 U/L (ref 0–44)
AST: 25 U/L (ref 15–41)
Albumin: 3.7 g/dL (ref 3.5–5.0)
Alkaline Phosphatase: 81 U/L (ref 38–126)
Anion gap: 12 (ref 5–15)
BUN: 15 mg/dL (ref 6–20)
CO2: 23 mmol/L (ref 22–32)
Calcium: 8.5 mg/dL — ABNORMAL LOW (ref 8.9–10.3)
Chloride: 106 mmol/L (ref 98–111)
Creatinine, Ser: 0.74 mg/dL (ref 0.61–1.24)
GFR calc Af Amer: >60 mL/min (ref >60)
GFR calc non Af Amer: >60 mL/min (ref >60)
Glucose, Bld: 120 mg/dL — ABNORMAL HIGH (ref 70–99)
Potassium: 3.8 mmol/L (ref 3.5–5.1)
Sodium: 141 mmol/L (ref 135–145)
Total Bilirubin: 0.3 mg/dL (ref 0.3–1.2)
Total Protein: 6.7 g/dL (ref 6.5–8.1)

## 2020-05-27 LAB — ACETAMINOPHEN LEVEL: Acetaminophen (Tylenol), Serum: 14 ug/mL (ref 10–30)

## 2020-05-27 LAB — SALICYLATE LEVEL: Salicylate Lvl: <7 mg/dL — ABNORMAL LOW (ref 7.0–30.0)

## 2020-05-27 LAB — ETHANOL: Alcohol, Ethyl (B): <10 mg/dL (ref <10)

## 2020-05-27 NOTE — Consult Note (Signed)
[facility] Consult   Reason for Consult: Psychiatric problem Referring Physician: Emergency room physician Patient Identification: Michael Farrell. MRN:  [id number] Principal Diagnosis: <principal problem not specified> Diagnosis:  Active Problems:   * No active hospital problems. *   Total Time spent with patient: 30 minutes  Subjective:   [name]. is a 49 y.o. male patient admitted with homicidal ideation.  HPI: Patient is seen and examined.  Patient is a 49 year old male with a past psychiatric history significant for amphetamine induced psychotic disorder, methamphetamine dependence.  Patient is familiar to the behavioral health service.  Most recently the patient was seen in the behavioral health observation unit on 05/26/2020.  He was discharged to home on his previous medications including Zyprexa Zydis, hydroxyzine, gabapentin and Lexapro.  Apparently the patient then left the observation and went to the [facility] emergency department.  He presented with homicidal ideation and psychotic symptoms.  He has a longstanding history of noncompliance with treatment as well as continued methamphetamine abuse/dependence.  The patient was recently admitted to my service on 05/16/2020.  He remained in the hospital till 05/19/2020.  He then presented to another emergency department on 6/6 with chest pain.  And then again was in the observation unit on 05/26/2020.  It is felt that his presentation is for secondary gain, and that if he takes his medications and avoids methamphetamines he will do fine.  After examination today his major request was asking for transportation back to the [place] area.  Past Psychiatric History: He has a longstanding history of methamphetamine dependence as well as psychosis following methamphetamine use.  His presentation to the mental health system as outlined above.  Risk to Self: Suicidal Ideation: Yes-Currently  Present Suicidal Intent: No Is patient at risk for suicide?: Yes Suicidal Plan?: No Specify Current Suicidal Plan:  (Denies) Access to Means: No Specify Access to Suicidal Means: NA What has been your use of drugs/alcohol within the last 12 months?: NA How many times?: 1 Other Self Harm Risks: None Triggers for Past Attempts: Unknown Intentional Self Injurious Behavior: None Risk to Others: Homicidal Ideation: No Thoughts of Harm to Others: No Current Homicidal Intent: No Current Homicidal Plan: No Access to Homicidal Means: No Identified Victim: NA History of harm to others?: No Assessment of Violence: In distant past Violent Behavior Description: Hx while in prison Does patient have access to weapons?: No Criminal Charges Pending?: No Does patient have a court date: No Prior Inpatient Therapy: Prior Inpatient Therapy: Yes Prior Therapy Dates: 2021 Prior Therapy Facilty/Provider(s): [facility] Reason for Treatment: MH issues Prior Outpatient Therapy: Prior Outpatient Therapy: No Does patient have an ACCT team?: No Does patient have Intensive In-House Services?  : No Does patient have [facility] services? : No Does patient have P4CC services?: No  Past Medical History:  Past Medical History:  Diagnosis Date  . Anxiety   . Depression   . Pulmonary embolus [facility])     Past Surgical History:  Procedure Laterality Date  . CLAVICLE SURGERY     pt unsure whether it was left or right; has been broken "a couple times"  . LEG AMPUTATION     Family History:  Family History  Family history unknown: Yes   Family Psychiatric  History: Noncontributory Social History:  Social History   Substance and Sexual Activity  Alcohol Use Not Currently     Social History   Substance and Sexual Activity  Drug Use Yes  .  Types: Marijuana    Social History   Socioeconomic History  . Marital status: Single    Spouse name: Not on file  . Number of children: Not on file  . Years of  education: Not on file  . Highest education level: Not on file  Occupational History  . Not on file  Tobacco Use  . Smoking status: Current Every Day Smoker    Packs/day: 1.00    Types: Cigarettes  . Smokeless tobacco: Former Clinical biochemist  . Vaping Use: Never used  Substance and Sexual Activity  . Alcohol use: Not Currently  . Drug use: Yes    Types: Marijuana  . Sexual activity: Not on file  Other Topics Concern  . Not on file  Social History Narrative  . Not on file   Social Determinants of Health   Financial Resource Strain:   . Difficulty of Paying Living Expenses:   Food Insecurity:   . Worried About Programme researcher, broadcasting/film/video in the Last Year:   . Barista in the Last Year:   Transportation Needs:   . Freight forwarder (Medical):   Marland Kitchen Lack of Transportation (Non-Medical):   Physical Activity:   . Days of Exercise per Week:   . Minutes of Exercise per Session:   Stress:   . Feeling of Stress :   Social Connections:   . Frequency of Communication with Friends and Family:   . Frequency of Social Gatherings with Friends and Family:   . Attends Religious Services:   . Active Member of Clubs or Organizations:   . Attends Banker Meetings:   Marland Kitchen Marital Status:    Additional Social History:    Allergies:  No Known Allergies  Labs:  Results for orders placed or performed during the hospital encounter of 05/27/20 (from the past 48 hour(s))  Comprehensive metabolic panel     Status: Abnormal   Collection Time: 05/27/20  4:12 AM  Result Value Ref Range   Sodium 141 135 - 145 mmol/L   Potassium 3.8 3.5 - 5.1 mmol/L   Chloride 106 98 - 111 mmol/L   CO2 23 22 - 32 mmol/L   Glucose, Bld 120 (H) 70 - 99 mg/dL    Comment: Glucose reference range applies only to samples taken after fasting for at least 8 hours.   BUN 15 6 - 20 mg/dL   Creatinine, Ser 7.02 0.61 - 1.24 mg/dL   Calcium 8.5 (L) 8.9 - 10.3 mg/dL   Total Protein 6.7 6.5 - 8.1 g/dL    Albumin 3.7 3.5 - 5.0 g/dL   AST 25 15 - 41 U/L   ALT 32 0 - 44 U/L   Alkaline Phosphatase 81 38 - 126 U/L   Total Bilirubin 0.3 0.3 - 1.2 mg/dL   GFR calc non Af Amer >60 >60 mL/min   GFR calc Af Amer >60 >60 mL/min   Anion gap 12 5 - 15    Comment: Performed at [facility], [address] [place], [place] [address]  Ethanol     Status: None   Collection Time: 05/27/20  4:12 AM  Result Value Ref Range   Alcohol, Ethyl (B) <10 <10 mg/dL    Comment: (NOTE) Lowest detectable limit for serum alcohol is 10 mg/dL.  For medical purposes only. Performed at [facility], [address] [place], [place] [address]   Salicylate level     Status: Abnormal  Collection Time: 05/27/20  4:12 AM  Result Value Ref Range   Salicylate Lvl <7.0 (L) 7.0 - 30.0 mg/dL    Comment: Performed at [facility], [address], [place], [place] [address]  Acetaminophen level     Status: None   Collection Time: 05/27/20  4:12 AM  Result Value Ref Range   Acetaminophen (Tylenol), Serum 14 10 - 30 ug/mL    Comment: (NOTE) Therapeutic concentrations vary significantly. A range of 10-30 ug/mL  may be an effective concentration for many patients. However, some  are best treated at concentrations outside of this range. Acetaminophen concentrations >150 ug/mL at 4 hours after ingestion  and >50 ug/mL at 12 hours after ingestion are often associated with  toxic reactions.  Performed at [facility], [address] [place], [place] [address]   cbc     Status: Abnormal   Collection Time: 05/27/20  4:12 AM  Result Value Ref Range   WBC 14.8 (H) 4.0 - 10.5 K/uL   RBC 4.68 4.22 - 5.81 MIL/uL   Hemoglobin 13.4 13.0 - 17.0 g/dL   HCT 60.6 39 - 52 %   MCV 90.4 80.0 - 100.0 fL   MCH 28.6 26.0 - 34.0 pg   MCHC 31.7 30.0 - 36.0 g/dL   RDW 30.1 60.1 - 09.3 %   Platelets 306 150 - 400 K/uL   nRBC 0.0 0.0 - 0.2 %    Comment: Performed  at [facility], [address], [place], [place] [address]    No current facility-administered medications for this encounter.   Current Outpatient Medications  Medication Sig Dispense Refill  . apixaban (ELIQUIS) 5 MG TABS tablet Take 5 mg by mouth 2 (two) times daily.    Marland Kitchen aspirin EC 81 MG tablet Take 81 mg by mouth daily.    . cyclobenzaprine (FLEXERIL) 10 MG tablet Take 1 tablet (10 mg total) by mouth 3 (three) times daily as needed for muscle spasms. 30 tablet 0  . escitalopram (LEXAPRO) 20 MG tablet Take 1 tablet (20 mg total) by mouth daily. For depression 30 tablet 0  . gabapentin (NEURONTIN) 400 MG capsule Take 1 capsule (400 mg total) by mouth 3 (three) times daily. For agitation 45 capsule 1  . hydrOXYzine (ATARAX/VISTARIL) 25 MG tablet Take 1 tablet (25 mg total) by mouth every 6 (six) hours as needed for anxiety. 75 tablet 0  . metoprolol tartrate (LOPRESSOR) 25 MG tablet Take 1 tablet (25 mg total) by mouth 2 (two) times daily. For high blood pressure 20 tablet 0  . OLANZapine zydis (ZYPREXA) 10 MG disintegrating tablet Take 1 tablet (10 mg total) by mouth at bedtime. For mood control 30 tablet 0    Musculoskeletal: Strength & Muscle Tone: within normal limits Gait & Station: normal Patient leans: N/A  Psychiatric Specialty Exam: Physical Exam  Nursing note and vitals reviewed. Constitutional: He is oriented to person, place, and time.  HENT:  Head: Normocephalic and atraumatic.  Respiratory: Effort normal.  GI: Normal appearance.  Neurological: He is alert and oriented to person, place, and time.    Review of Systems  Blood pressure 114/87, pulse 96, temperature 98.2 F (36.8 C), temperature source Oral, resp. rate 16, SpO2 97 %.There is no height or weight on file to calculate BMI.  General Appearance: Disheveled  Eye Contact:  Fair  Speech:  Normal Rate  Volume:  Normal  Mood:  Dysphoric  Affect:  Blunt  Thought Process:  Coherent and  Descriptions of Associations: Circumstantial  Orientation:  Full (Time, Place, and Person)  Thought Content:  Rumination  Suicidal Thoughts:  No  Homicidal Thoughts:  No  Memory:  Immediate;   Poor Recent;   Poor Remote;   Poor  Judgement:  Impaired  Insight:  Lacking  Psychomotor Activity:  Normal  Concentration:  Concentration: Fair and Attention Span: Fair  Recall:  [employer] of Knowledge:  Fair  Language:  Fair  Akathisia:  Negative  Handed:  Right  AIMS (if indicated):     Assets:  Desire for Improvement Resilience  ADL's:  Intact  Cognition:  WNL  Sleep:        Treatment Plan Summary: Daily contact with patient to assess and evaluate symptoms and progress in treatment, Medication management and Plan : Patient is seen and examined.  Patient is a 49 year old male with the above-stated past psychiatric history who was seen in psychiatric consultation in the [facility] emergency department.  It is felt that his presentation is for secondary gain.  Recommendations are for him to be discharged from the emergency room.  Recommendations include not using methamphetamines as well as taking his medications for medical and psychiatric reasons with improved compliance.  His vital signs are stable, he is afebrile.  His pulse oximetry is 97% on room air.  Review of his laboratories revealed a mildly elevated glucose at 120 on admission, and otherwise all electrolytes were normal.  His CBC revealed a mildly elevated white blood cell count of 14.8.  His acetaminophen was 14, salicylate was less than 7.  Blood alcohol was less than 10, salicylate was less than 7.  A new drug screen was not obtained and the only one there was from 6/3.  Disposition: No evidence of imminent risk to self or others at present.    Antonieta Pert, MD 05/27/2020 12:05 PM
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
## 2020-05-27 NOTE — Discharge Instructions (Signed)
For your behavioral health needs, you are advised to follow up with one of the providers listed below.  Contact them at your earliest opportunity to schedule an intake appointment:  IN [place]:       [facility]      [address]      [place], [place] [address]      [phone]  [place]:       [facility]      [address]      [place], [place] [address]      [phone]

## 2020-05-27 NOTE — Progress Notes (Signed)
TOC CM spoke to pt and states he is working with Marylene Land, [facility]. Spoke to [place] and states she will bring pt pads for his prosthesis to [facility] on 05/28/2020. Pt did phone assessment for [facility] and agreeable to transport to shelter. Marylene Land states she will continue to work with helping pt get housing. [employer] waiver completed and emailed. Called transport to [facility]. Pt provided copy. Isidoro Donning RN CCM, [facility] TOC CM [phone]

## 2020-05-27 NOTE — ED Notes (Signed)
Patient dressed out into scrubs and yellow socks, belongings placed in cabinet behind nurses station and knives locked up with security.
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
## 2020-05-27 NOTE — ED Provider Notes (Signed)
[place] DEPT Provider Note   CSN: [id number] Arrival date & time: 05/27/20  0331     History Chief Complaint  Patient presents with  . Suicidal    Michael Farrell. is a 49 y.o. male.  The history is provided by the patient and medical records. No language interpreter was used.  Mental Health Problem Presenting symptoms: hallucinations and suicidal thoughts   Presenting symptoms: no agitation, no homicidal ideas, no suicidal threats and no suicide attempt   Degree of incapacity (severity):  Moderate Onset quality:  Gradual Duration:  3 weeks Timing:  Constant Progression:  Worsening Chronicity:  New Treatment compliance:  Untreated Associated symptoms: anxiety   Associated symptoms: no abdominal pain, no chest pain, no fatigue and no headaches   Risk factors: hx of mental illness   Risk factors: no hx of suicide attempts        Past Medical History:  Diagnosis Date  . Anxiety   . Depression   . Pulmonary embolus [facility])     Patient Active Problem List   Diagnosis Date Noted  . Depression 05/17/2020  . MDD (major depressive disorder) 05/17/2020  . MDD (major depressive disorder), recurrent episode, severe ([place]) 05/16/2020  . 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 Moderate benzodiazepine use disorder ([place]) 05/01/2020    Past Surgical History:  Procedure Laterality Date  . CLAVICLE SURGERY     pt unsure whether it was left or right; has been broken "a couple times"  . LEG AMPUTATION         Family History  Family history unknown: Yes    Social History   Tobacco Use  . Smoking status: Current Every Day Smoker    Packs/day: 1.00    Types: Cigarettes  . Smokeless tobacco: Former Network engineer  . Vaping Use: Never used  Substance Use Topics  . Alcohol use: Not Currently  . Drug use: Yes    Types: Marijuana    Home Medications Prior to Admission  medications   Medication Sig Start Date End Date Taking? Authorizing Provider  apixaban (ELIQUIS) 5 MG TABS tablet Take 5 mg by mouth 2 (two) times daily.    [provider]  aspirin EC 81 MG tablet Take 81 mg by mouth daily.    [provider]  cyclobenzaprine (FLEXERIL) 10 MG tablet Take 1 tablet (10 mg total) by mouth 3 (three) times daily as needed for muscle spasms. 05/19/20   Lindell Spar I, NP  escitalopram (LEXAPRO) 20 MG tablet Take 1 tablet (20 mg total) by mouth daily. For depression 05/20/20   Lindell Spar I, NP  gabapentin (NEURONTIN) 400 MG capsule Take 1 capsule (400 mg total) by mouth 3 (three) times daily. For agitation 05/19/20   Lindell Spar I, NP  hydrOXYzine (ATARAX/VISTARIL) 25 MG tablet Take 1 tablet (25 mg total) by mouth every 6 (six) hours as needed for anxiety. 05/19/20   Lindell Spar I, NP  metoprolol tartrate (LOPRESSOR) 25 MG tablet Take 1 tablet (25 mg total) by mouth 2 (two) times daily. For high blood pressure 05/19/20   Nwoko, Herbert Pun I, NP  OLANZapine zydis (ZYPREXA) 10 MG disintegrating tablet Take 1 tablet (10 mg total) by mouth at bedtime. For mood control 05/19/20   Encarnacion Slates, NP    Allergies    Patient has no known allergies.  Review of Systems   Review of Systems  Constitutional: Negative for chills,  diaphoresis, fatigue and fever.  HENT: Negative for congestion.   Respiratory: Negative for cough, chest tightness, shortness of breath and wheezing.   Cardiovascular: Negative for chest pain.  Gastrointestinal: Negative for abdominal pain.  Genitourinary: Negative for dysuria and flank pain.  Musculoskeletal: Negative for back pain, neck pain and neck stiffness.  Neurological: Negative for dizziness, weakness, light-headedness and headaches.  Psychiatric/Behavioral: Positive for hallucinations and suicidal ideas. Negative for agitation, confusion and homicidal ideas. The patient is nervous/anxious.   All other systems reviewed and are  negative.   Physical Exam Updated Vital Signs BP 127/81 (BP Location: Left Arm)   Pulse (!) 105   Temp 98.2 F (36.8 C) (Oral)   Resp 13   SpO2 96%   Physical Exam Vitals and nursing note reviewed.  Constitutional:      General: He is not in acute distress.    Appearance: He is well-developed. He is not ill-appearing, toxic-appearing or diaphoretic.  HENT:     Head: Normocephalic and atraumatic.     Nose: No congestion or rhinorrhea.     Mouth/Throat:     Mouth: Mucous membranes are moist.     Pharynx: No oropharyngeal exudate or posterior oropharyngeal erythema.  Eyes:     Conjunctiva/sclera: Conjunctivae normal.  Cardiovascular:     Rate and Rhythm: Normal rate and regular rhythm.     Pulses: Normal pulses.     Heart sounds: No murmur heard.   Pulmonary:     Effort: Pulmonary effort is normal. No respiratory distress.     Breath sounds: Normal breath sounds. No wheezing, rhonchi or rales.  Chest:     Chest wall: No tenderness.  Abdominal:     General: Abdomen is flat.     Palpations: Abdomen is soft.     Tenderness: There is no abdominal tenderness. There is no right CVA tenderness, left CVA tenderness, guarding or rebound.  Musculoskeletal:        General: No tenderness.     Cervical back: Normal range of motion.  Skin:    General: Skin is warm and dry.  Neurological:     General: No focal deficit present.     Mental Status: He is alert.     Sensory: No sensory deficit.     Motor: No weakness.  Psychiatric:        Attention and Perception: He perceives auditory hallucinations.        Mood and Affect: Mood is depressed.        Behavior: Behavior is not agitated or aggressive.        Thought Content: Thought content includes suicidal ideation. Thought content does not include homicidal ideation. Thought content includes suicidal plan. Thought content does not include homicidal plan.     ED Results / Procedures / Treatments   Labs (all labs ordered are  listed, but only abnormal results are displayed) Labs Reviewed  COMPREHENSIVE METABOLIC PANEL - Abnormal; Notable for the following components:      Result Value   Glucose, Bld 120 (*)    Calcium 8.5 (*)    All other components within normal limits  SALICYLATE LEVEL - Abnormal; Notable for the following components:   Salicylate Lvl <7.0 (*)    All other components within normal limits  CBC - Abnormal; Notable for the following components:   WBC 14.8 (*)    All other components within normal limits  ETHANOL  ACETAMINOPHEN LEVEL  RAPID URINE DRUG SCREEN, HOSP PERFORMED    EKG  None  Radiology No results found.  Procedures Procedures (including critical care time)  Medications Ordered in ED Medications - No data to display  ED Course  I have reviewed the triage vital signs and the nursing notes.  Pertinent labs & imaging results that were available during my care of the patient were reviewed by me and considered in my medical decision making (see chart for details).    MDM Rules/Calculators/A&P                          Jameire Kouba. is a 49 y.o. male with a past medical history significant for polysubstance abuse, depression, anxiety, and prior pulmonary embolism on Eliquis therapy who presents with auditory hallucinations and suicidal ideation.  Patient reports that he has a plan to slit his own wrist as his only voice that are telling him to kill himself.  He denies any homicidal ideation.  He denies any other physical complaints.  He denies any headache, chest pain, back pain, shortness of breath, or other physical problems.  He told the triage nurse that there was some water in his tent he lives in but he did not want to talk about that this morning with me.  He does report that he is still hearing the voices and having thoughts of hurting himself at this time.  On exam, lungs are clear and chest is nontender.  Abdomen is nontender.  Patient is resting calmly and  comfortably.  He is still reporting the suicidal thoughts and some hallucinations.  Patient does have screening blood work done in triage and has a mild leukocytosis but otherwise is reassuring.  UDS is still awaiting collection.  Patient is in no distress.  Patient is medically clear for psychiatric management at this time.   11:25 AM BH H team reports patient is safe for discharge home with social work consult for transportation.  Social work consult was placed and patient will be discharged.    Final Clinical Impression(s) / ED Diagnoses Final diagnoses:  Suicidal ideation  Auditory hallucinations    Rx / DC Orders ED Discharge Orders    None      Clinical Impression: 1. Suicidal ideation   2. Auditory hallucinations     Disposition: Discharge  Condition: Good  I have discussed the results, Dx and Tx plan with the pt(& family if present). He/she/they expressed understanding and agree(s) with the plan. Discharge instructions discussed at great length. Strict return precautions discussed and pt &/or family have verbalized understanding of the instructions. No further questions at time of discharge.    New Prescriptions   No medications on file    Follow Up: [facility] [address] [id number] mc [place] [address] [phone]    Placey, Chales Abrahams, NP [address] [place] [address] [phone]        Talya Quain, Canary Brim, MD 05/27/20 1134

## 2020-05-27 NOTE — BH Assessment (Signed)
[facility] Assessment Progress Note  Per Landry Mellow, MD, this pt does not require psychiatric hospitalization at this time.  Pt is to be discharged from [facility] with outpatient resources.  These have been included in pt's discharge instructions.  A social work consult is to be ordered to facilitate transportation for pt.  Pt's nurse has been notified.  Doylene Canning, MA Triage Specialist [phone]

## 2020-09-01 ENCOUNTER — Emergency Department (HOSPITAL_COMMUNITY): Payer: HRSA Program

## 2020-09-01 ENCOUNTER — Encounter (HOSPITAL_COMMUNITY): Payer: Self-pay

## 2020-09-01 ENCOUNTER — Emergency Department (HOSPITAL_COMMUNITY)
Admission: EM | Admit: 2020-09-01 | Discharge: 2020-09-02 | Disposition: A | Discharge status: Home / Self Care | Payer: HRSA Program | Attending: Emergency Medicine | Admitting: Emergency Medicine

## 2020-09-01 ENCOUNTER — Other Ambulatory Visit: Payer: Self-pay

## 2020-09-01 DIAGNOSIS — F17210 Nicotine dependence, cigarettes, uncomplicated: Secondary | ICD-10-CM | POA: Diagnosis not present

## 2020-09-01 DIAGNOSIS — Z7901 Long term (current) use of anticoagulants: Secondary | ICD-10-CM | POA: Insufficient documentation

## 2020-09-01 DIAGNOSIS — I10 Essential (primary) hypertension: Secondary | ICD-10-CM | POA: Insufficient documentation

## 2020-09-01 DIAGNOSIS — Z7982 Long term (current) use of aspirin: Secondary | ICD-10-CM | POA: Diagnosis not present

## 2020-09-01 DIAGNOSIS — R079 Chest pain, unspecified: Secondary | ICD-10-CM | POA: Diagnosis present

## 2020-09-01 DIAGNOSIS — U071 COVID-19: Secondary | ICD-10-CM | POA: Diagnosis not present

## 2020-09-01 DIAGNOSIS — Z79899 Other long term (current) drug therapy: Secondary | ICD-10-CM | POA: Diagnosis not present

## 2020-09-01 HISTORY — DX: Essential (primary) hypertension: I10

## 2020-09-01 LAB — TROPONIN I (HIGH SENSITIVITY)
Troponin I (High Sensitivity): 3 ng/L (ref <18)
Troponin I (High Sensitivity): 4 ng/L (ref <18)

## 2020-09-01 LAB — BASIC METABOLIC PANEL
Anion gap: 10 (ref 5–15)
BUN: 11 mg/dL (ref 6–20)
CO2: 22 mmol/L (ref 22–32)
Calcium: 8.5 mg/dL — ABNORMAL LOW (ref 8.9–10.3)
Chloride: 104 mmol/L (ref 98–111)
Creatinine, Ser: 0.84 mg/dL (ref 0.61–1.24)
GFR calc Af Amer: >60 mL/min (ref >60)
GFR calc non Af Amer: >60 mL/min (ref >60)
Glucose, Bld: 95 mg/dL (ref 70–99)
Potassium: 3.2 mmol/L — ABNORMAL LOW (ref 3.5–5.1)
Sodium: 136 mmol/L (ref 135–145)

## 2020-09-01 LAB — CBC
HCT: 44.4 % (ref 39.0–52.0)
Hemoglobin: 13.8 g/dL (ref 13.0–17.0)
MCH: 28.5 pg (ref 26.0–34.0)
MCHC: 31.1 g/dL (ref 30.0–36.0)
MCV: 91.5 fL (ref 80.0–100.0)
Platelets: 273 10*3/uL (ref 150–400)
RBC: 4.85 MIL/uL (ref 4.22–5.81)
RDW: 13.7 % (ref 11.5–15.5)
WBC: 14.7 10*3/uL — ABNORMAL HIGH (ref 4.0–10.5)
nRBC: 0 % (ref 0.0–0.2)

## 2020-09-01 LAB — SARS CORONAVIRUS 2 BY RT PCR (HOSPITAL ORDER, PERFORMED IN ~~LOC~~ HOSPITAL LAB): SARS Coronavirus 2: POSITIVE — AB

## 2020-09-01 LAB — PROTIME-INR
INR: 1 (ref 0.8–1.2)
Prothrombin Time: 13.2 seconds (ref 11.4–15.2)

## 2020-09-01 MED ORDER — ACETAMINOPHEN 325 MG PO TABS
650.0000 mg | ORAL_TABLET | Freq: Once | ORAL | Status: AC | PRN
  Administered 2020-09-01: 650 mg via ORAL
  Filled 2020-09-01: qty 2

## 2020-09-01 NOTE — ED Notes (Signed)
No answer for vitals  
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
## 2020-09-01 NOTE — ED Notes (Signed)
Pt brought back to triage for repeat Trop, temp recheck, COVID testing, and Tylenol administration.  Updated on wait for treatment room.

## 2020-09-02 ENCOUNTER — Emergency Department (HOSPITAL_COMMUNITY): Payer: HRSA Program

## 2020-09-02 ENCOUNTER — Encounter (HOSPITAL_COMMUNITY): Payer: Self-pay | Admitting: Student

## 2020-09-02 LAB — LIPASE, BLOOD: Lipase: 36 U/L (ref 11–51)

## 2020-09-02 LAB — HEPATIC FUNCTION PANEL
ALT: 43 U/L (ref 0–44)
AST: 39 U/L (ref 15–41)
Albumin: 3.4 g/dL — ABNORMAL LOW (ref 3.5–5.0)
Alkaline Phosphatase: 77 U/L (ref 38–126)
Bilirubin, Direct: 0.1 mg/dL (ref 0.0–0.2)
Indirect Bilirubin: 0.3 mg/dL (ref 0.3–0.9)
Total Bilirubin: 0.4 mg/dL (ref 0.3–1.2)
Total Protein: 7 g/dL (ref 6.5–8.1)

## 2020-09-02 MED ORDER — OLANZAPINE 10 MG PO TABS: 10.0000 mg | ORAL_TABLET | Freq: Every day | ORAL | 0 refills | Status: DC

## 2020-09-02 MED ORDER — METOPROLOL TARTRATE 25 MG PO TABS
25.0000 mg | ORAL_TABLET | Freq: Two times a day (BID) | ORAL | 0 refills | Status: DC
Start: 2020-09-02 — End: 2021-02-11

## 2020-09-02 MED ORDER — ALBUTEROL SULFATE HFA 108 (90 BASE) MCG/ACT IN AERS: 2.0000 | INHALATION_SPRAY | Freq: Once | RESPIRATORY_TRACT | Status: DC | PRN

## 2020-09-02 MED ORDER — DIPHENHYDRAMINE HCL 50 MG/ML IJ SOLN: 50.0000 mg | Freq: Once | INTRAMUSCULAR | Status: DC | PRN

## 2020-09-02 MED ORDER — OLANZAPINE 10 MG PO TBDP: 10.0000 mg | ORAL_TABLET | Freq: Every day | ORAL | 0 refills | Status: DC

## 2020-09-02 MED ORDER — APIXABAN 5 MG PO TABS
5.0000 mg | ORAL_TABLET | Freq: Two times a day (BID) | ORAL | 0 refills | Status: DC
Start: 2020-09-02 — End: 2021-02-11

## 2020-09-02 MED ORDER — FENTANYL CITRATE (PF) 100 MCG/2ML IJ SOLN
50.0000 ug | Freq: Once | INTRAMUSCULAR | Status: AC
  Administered 2020-09-02: 50 ug via INTRAVENOUS
  Filled 2020-09-02: qty 2

## 2020-09-02 MED ORDER — HYDROXYZINE HCL 25 MG PO TABS
25.0000 mg | ORAL_TABLET | Freq: Three times a day (TID) | ORAL | 0 refills | Status: DC | PRN
Start: 2020-09-02 — End: 2021-02-11

## 2020-09-02 MED ORDER — POTASSIUM CHLORIDE CRYS ER 20 MEQ PO TBCR
40.0000 meq | EXTENDED_RELEASE_TABLET | Freq: Once | ORAL | Status: AC
  Administered 2020-09-02: 40 meq via ORAL
  Filled 2020-09-02: qty 2

## 2020-09-02 MED ORDER — IOHEXOL 350 MG/ML SOLN
100.0000 mL | Freq: Once | INTRAVENOUS | Status: AC | PRN
  Administered 2020-09-02: 100 mL via INTRAVENOUS

## 2020-09-02 MED ORDER — SODIUM CHLORIDE 0.9 % IV SOLN
1200.0000 mg | Freq: Once | INTRAVENOUS | Status: AC
  Administered 2020-09-02: 1200 mg via INTRAVENOUS
  Filled 2020-09-02: qty 10

## 2020-09-02 MED ORDER — ALUM & MAG HYDROXIDE-SIMETH 200-200-20 MG/5ML PO SUSP
30.0000 mL | Freq: Once | ORAL | Status: AC
  Administered 2020-09-02: 30 mL via ORAL
  Filled 2020-09-02: qty 30

## 2020-09-02 MED ORDER — GABAPENTIN 400 MG PO CAPS: 400.0000 mg | ORAL_CAPSULE | Freq: Three times a day (TID) | ORAL | 0 refills | Status: DC

## 2020-09-02 MED ORDER — EPINEPHRINE 0.3 MG/0.3ML IJ SOAJ: 0.3000 mg | Freq: Once | INTRAMUSCULAR | Status: DC | PRN

## 2020-09-02 MED ORDER — SODIUM CHLORIDE 0.9 % IV SOLN: INTRAVENOUS | Status: DC | PRN

## 2020-09-02 MED ORDER — ESCITALOPRAM OXALATE 20 MG PO TABS
20.0000 mg | ORAL_TABLET | Freq: Every day | ORAL | 0 refills | Status: DC
Start: 2020-09-02 — End: 2021-02-11

## 2020-09-02 MED ORDER — METHYLPREDNISOLONE SODIUM SUCC 125 MG IJ SOLR: 125.0000 mg | Freq: Once | INTRAMUSCULAR | Status: DC | PRN

## 2020-09-02 MED ORDER — ASPIRIN EC 81 MG PO TBEC: 81.0000 mg | DELAYED_RELEASE_TABLET | Freq: Every day | ORAL | 0 refills | Status: AC

## 2020-09-02 MED ORDER — FAMOTIDINE IN NACL 20-0.9 MG/50ML-% IV SOLN: 20.0000 mg | Freq: Once | INTRAVENOUS | Status: DC | PRN

## 2020-09-02 MED FILL — GABAPENTIN 400 MG CAPSULE: 400 | 15 days supply | Qty: 45 | Fill #0

## 2020-09-02 MED FILL — hydrOXYzine HCL 25 MG TABS: 25 | 15 days supply | Qty: 45 | Fill #0

## 2020-09-02 MED FILL — METOPROLOL TARTRATE 25 MG T: 25 | 30 days supply | Qty: 60 | Fill #0

## 2020-09-02 MED FILL — ELIQUIS 5 MG TABLET: 5 | 30 days supply | Qty: 60 | Fill #0

## 2020-09-02 MED FILL — ESCITALOPRAM 20 MG TABLET: 20 | 30 days supply | Qty: 30 | Fill #0

## 2020-09-02 MED FILL — ASPIRIN LOW DOSE 81 MG TBEC: 81 | 30 days supply | Qty: 30 | Fill #0

## 2020-09-02 MED FILL — OLANZapine 10 MG TABS: 10 | 30 days supply | Qty: 30 | Fill #0

## 2020-09-02 NOTE — Progress Notes (Addendum)
1:30pm: CSW was notified that transportation for the patient is outside the [facility] ED entrance.   12pm: CSW was notified that transport via for this patient will arrive between 1pm and 2pm today. The patient will go to room 219 at the [facility].  Magda Paganini, RN and [place], [place] notified of information.  10am: CSW completed referral for the [facility] hotel and sent it to [facility] at [facility] for review.  9am: CSW spoke with Chantelle at [facility] Department regarding the referral process for the COVID hotel. Chantell to email CSW the referral form.  Edwin Dada, MSW, LCSW-A Transitions of Care  Clinical Social Worker  [facility] Emergency Departments  Medical ICU [phone]

## 2020-09-02 NOTE — Discharge Instructions (Addendum)
Your COVID test was positive in the ER.  Your labs showed your potassium was a bit low- please see attached diet recommendations to try to supplement your potassium. Your labs otherwise were similar to prior blood work you have had done. Your Chest CT did not show a blood clot, it did show findings consistent with COVID.   Please take tylenol as needed for fever per over the counter dosing.  We are instructing patient's with COVID 19 or symptoms of COVID 19 to isolate themselves for 14 days. You may be able to discontinue self isolation if the following conditions are met:   Persons with COVID-19 who have symptoms and were directed to care for themselves at home may discontinue home isolation under the  following conditions: - It has been at least 7 days have passed since symptoms first appeared. - AND at least 3 days (72 hours) have passed since recovery defined as resolution of fever without the use of fever-reducing medications and improvement in respiratory symptoms (e.g., cough, shortness of breath)  Please follow the below quarantine instructions.   Please follow up with primary care within 3-5 days for re-evaluation- call prior to going to the office to make them aware of your symptoms as some offices are altering their method of seeing patients with COVID 19 symptoms. Return to the ER for new or worsening symptoms including but not limited to increased work of breathing, chest pain, passing out, or any other concerns.       Person Under Monitoring Name: Michael Farrell.  Location: [place] [address]   Infection Prevention Recommendations for Individuals Confirmed to have, or Being Evaluated for, 2019 Novel Coronavirus (COVID-19) Infection Who Receive Care at Home  Individuals who are confirmed to have, or are being evaluated for, COVID-19 should follow the prevention steps below until a healthcare provider or local or state health department says they can return to  normal activities.  Stay home except to get medical care You should restrict activities outside your home, except for getting medical care. Do not go to work, school, or public areas, and do not use public transportation or taxis.  Call ahead before visiting your doctor Before your medical appointment, call the healthcare provider and tell them that you have, or are being evaluated for, COVID-19 infection. This will help the healthcare provider's office take steps to keep other people from getting infected. Ask your healthcare provider to call the local or state health department.  Monitor your symptoms Seek prompt medical attention if your illness is worsening (e.g., difficulty breathing). Before going to your medical appointment, call the healthcare provider and tell them that you have, or are being evaluated for, COVID-19 infection. Ask your healthcare provider to call the local or state health department.  Wear a facemask You should wear a facemask that covers your nose and mouth when you are in the same room with other people and when you visit a healthcare provider. People who live with or visit you should also wear a facemask while they are in the same room with you.  Separate yourself from other people in your home As much as possible, you should stay in a different room from other people in your home. Also, you should use a separate bathroom, if available.  Avoid sharing household items You should not share dishes, drinking glasses, cups, eating utensils, towels, bedding, or other items with other people in your home. After using these items, you should wash them thoroughly  with soap and water.  Cover your coughs and sneezes Cover your mouth and nose with a tissue when you cough or sneeze, or you can cough or sneeze into your sleeve. Throw used tissues in a lined trash can, and immediately wash your hands with soap and water for at least 20 seconds or use an alcohol-based  hand rub.  Wash your [facility] your hands often and thoroughly with soap and water for at least 20 seconds. You can use an alcohol-based hand sanitizer if soap and water are not available and if your hands are not visibly dirty. Avoid touching your eyes, nose, and mouth with unwashed hands.   Prevention Steps for Caregivers and Household Members of Individuals Confirmed to have, or Being Evaluated for, COVID-19 Infection Being Cared for in the Home  If you live with, or provide care at home for, a person confirmed to have, or being evaluated for, COVID-19 infection please follow these guidelines to prevent infection:  Follow healthcare provider's instructions Make sure that you understand and can help the patient follow any healthcare provider instructions for all care.  Provide for the patient's basic needs You should help the patient with basic needs in the home and provide support for getting groceries, prescriptions, and other personal needs.  Monitor the patient's symptoms If they are getting sicker, call his or her medical provider and tell them that the patient has, or is being evaluated for, COVID-19 infection. This will help the healthcare provider's office take steps to keep other people from getting infected. Ask the healthcare provider to call the local or state health department.  Limit the number of people who have contact with the patient If possible, have only one caregiver for the patient. Other household members should stay in another home or place of residence. If this is not possible, they should stay in another room, or be separated from the patient as much as possible. Use a separate bathroom, if available. Restrict visitors who do not have an essential need to be in the home.  Keep older adults, very young children, and other sick people away from the patient Keep older adults, very young children, and those who have compromised immune systems or chronic  health conditions away from the patient. This includes people with chronic heart, lung, or kidney conditions, diabetes, and cancer.  Ensure good ventilation Make sure that shared spaces in the home have good air flow, such as from an air conditioner or an opened window, weather permitting.  Wash your hands often Wash your hands often and thoroughly with soap and water for at least 20 seconds. You can use an alcohol based hand sanitizer if soap and water are not available and if your hands are not visibly dirty. Avoid touching your eyes, nose, and mouth with unwashed hands. Use disposable paper towels to dry your hands. If not available, use dedicated cloth towels and replace them when they become wet.  Wear a facemask and gloves Wear a disposable facemask at all times in the room and gloves when you touch or have contact with the patient's blood, body fluids, and/or secretions or excretions, such as sweat, saliva, sputum, nasal mucus, vomit, urine, or feces.  Ensure the mask fits over your nose and mouth tightly, and do not touch it during use. Throw out disposable facemasks and gloves after using them. Do not reuse. Wash your hands immediately after removing your facemask and gloves. If your personal clothing becomes contaminated, carefully remove clothing and launder.  Wash your hands after handling contaminated clothing. Place all used disposable facemasks, gloves, and other waste in a lined container before disposing them with other household waste. Remove gloves and wash your hands immediately after handling these items.  Do not share dishes, glasses, or other household items with the patient Avoid sharing household items. You should not share dishes, drinking glasses, cups, eating utensils, towels, bedding, or other items with a patient who is confirmed to have, or being evaluated for, COVID-19 infection. After the person uses these items, you should wash them thoroughly with soap and  water.  Wash laundry thoroughly Immediately remove and wash clothes or bedding that have blood, body fluids, and/or secretions or excretions, such as sweat, saliva, sputum, nasal mucus, vomit, urine, or feces, on them. Wear gloves when handling laundry from the patient. Read and follow directions on labels of laundry or clothing items and detergent. In general, wash and dry with the warmest temperatures recommended on the label.  Clean all areas the individual has used often Clean all touchable surfaces, such as counters, tabletops, doorknobs, bathroom fixtures, toilets, phones, keyboards, tablets, and bedside tables, every day. Also, clean any surfaces that may have blood, body fluids, and/or secretions or excretions on them. Wear gloves when cleaning surfaces the patient has come in contact with. Use a diluted bleach solution (e.g., dilute bleach with 1 part bleach and 10 parts water) or a household disinfectant with a label that says EPA-registered for coronaviruses. To make a bleach solution at home, add 1 tablespoon of bleach to 1 quart (4 cups) of water. For a larger supply, add  cup of bleach to 1 gallon (16 cups) of water. Read labels of cleaning products and follow recommendations provided on product labels. Labels contain instructions for safe and effective use of the cleaning product including precautions you should take when applying the product, such as wearing gloves or eye protection and making sure you have good ventilation during use of the product. Remove gloves and wash hands immediately after cleaning.  Monitor yourself for signs and symptoms of illness Caregivers and household members are considered close contacts, should monitor their health, and will be asked to limit movement outside of the home to the extent possible. Follow the monitoring steps for close contacts listed on the symptom monitoring form.   ? If you have additional questions, contact your local health  department or call the epidemiologist on call at [phone] (available 24/7). ? This guidance is subject to change. For the most up-to-date guidance from [facility], please refer to their website: [email or web address].pl

## 2020-09-02 NOTE — ED Notes (Signed)
Pt provided with meal and PO fluids, OK per PA. Tolerated well. Fed self.

## 2020-09-02 NOTE — Discharge Planning (Signed)
[facility] will deliver Rx to patient at bedside prior to discharge from hospital.  

## 2020-09-02 NOTE — Care Management (Signed)
RNCM consulted by EDP [place] for medication assistance. RNCM reviewed chart and spoke with the pt about [facility] MATCH program (co pay override for each Rx through [facility] program, does not include refills, 7 day expiration of MATCH letter and choice of pharmacies). Pt is eligible for [facility] MATCH program (unable to find pt listed in PROCARE per cardholder name inquiry) and has agreed to accept MATCH under terms discussed. PROCARE information entered. MATCH letter completed and provided to pt.  RNCM updated EDP and ED RN.
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
## 2020-09-02 NOTE — ED Notes (Signed)
Resent light green tubes for lipase/HEP fx.
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
## 2020-09-02 NOTE — Discharge Planning (Signed)
  [facility] Medication Assistance Card Name: Michael Farrell, Michael Farrell (MRN): [id number] Bin: [id number] RX Group: [id number] Discharge Date: 09/02/20 Expiration Date:09/11/19                                           (must be filled within 7 days of discharge)     You have been approved to have the prescriptions written by your discharging physician filled through our [facility] (Medication Assistance Through [facility]) program. This program allows for a one-time (no refills) 34-day supply of selected medications for a low copay amount.  The copay is $0.00 per prescription.   Only certain pharmacies are participating in this program with [facility]. You will need to select one of the pharmacies from the attached list and take your prescriptions, this letter, and your photo ID to one of the participating pharmacies.   We are excited that you are able to use the [facility] program to get your medications. These prescriptions must be filled within 7 days of hospital discharge or they will no longer be valid for the [facility] program. Should you have any problems with your prescriptions please contact your case management team member at [phone] for [place]/ [facility].  Thank you, [facility]

## 2020-09-02 NOTE — ED Notes (Signed)
Pt in NAD. Respirations even and nonlabored. Skin warm and dry. No evidence of acute reaction. Infusion not complete r/t pt bending arm while asleep, reinforced need to keep straight, pt verbalized understanding of same.

## 2020-09-06 ENCOUNTER — Telehealth: Payer: Self-pay | Admitting: *Deleted

## 2020-09-06 NOTE — Telephone Encounter (Signed)
Pt was called to make appt w/ [facility] per referral from [facility]. Unable to lvm

## 2020-11-18 ENCOUNTER — Encounter (HOSPITAL_COMMUNITY): Payer: Self-pay

## 2020-11-18 ENCOUNTER — Other Ambulatory Visit: Payer: Self-pay

## 2020-11-18 ENCOUNTER — Ambulatory Visit (HOSPITAL_COMMUNITY)
Admission: EM | Admit: 2020-11-18 | Discharge: 2020-11-19 | Disposition: A | Discharge status: Home / Self Care | Payer: Medicaid Other | Attending: Registered Nurse | Admitting: Registered Nurse

## 2020-11-18 DIAGNOSIS — F332 Major depressive disorder, recurrent severe without psychotic features: Secondary | ICD-10-CM

## 2020-11-18 DIAGNOSIS — F1910 Other psychoactive substance abuse, uncomplicated: Secondary | ICD-10-CM | POA: Diagnosis not present

## 2020-11-18 DIAGNOSIS — Z20822 Contact with and (suspected) exposure to covid-19: Secondary | ICD-10-CM | POA: Insufficient documentation

## 2020-11-18 DIAGNOSIS — F1510 Other stimulant abuse, uncomplicated: Secondary | ICD-10-CM | POA: Insufficient documentation

## 2020-11-18 DIAGNOSIS — F1320 Sedative, hypnotic or anxiolytic dependence, uncomplicated: Secondary | ICD-10-CM | POA: Diagnosis present

## 2020-11-18 DIAGNOSIS — R450 Nervousness: Secondary | ICD-10-CM | POA: Insufficient documentation

## 2020-11-18 DIAGNOSIS — F17210 Nicotine dependence, cigarettes, uncomplicated: Secondary | ICD-10-CM | POA: Insufficient documentation

## 2020-11-18 DIAGNOSIS — F4323 Adjustment disorder with mixed anxiety and depressed mood: Secondary | ICD-10-CM | POA: Diagnosis not present

## 2020-11-18 DIAGNOSIS — F1520 Other stimulant dependence, uncomplicated: Secondary | ICD-10-CM | POA: Diagnosis present

## 2020-11-18 DIAGNOSIS — Z79899 Other long term (current) drug therapy: Secondary | ICD-10-CM | POA: Insufficient documentation

## 2020-11-18 HISTORY — DX: Bipolar disorder, unspecified: F31.9

## 2020-11-18 HISTORY — DX: Post-traumatic stress disorder, unspecified: F43.10

## 2020-11-18 HISTORY — DX: Schizophrenia, unspecified: F20.9

## 2020-11-18 LAB — CBC WITH DIFFERENTIAL/PLATELET
Abs Immature Granulocytes: 0.04 10*3/uL (ref 0.00–0.07)
Basophils Absolute: 0 10*3/uL (ref 0.0–0.1)
Basophils Relative: 0 %
Eosinophils Absolute: 0.1 10*3/uL (ref 0.0–0.5)
Eosinophils Relative: 1 %
HCT: 41.2 % (ref 39.0–52.0)
Hemoglobin: 14.2 g/dL (ref 13.0–17.0)
Immature Granulocytes: 0 %
Lymphocytes Relative: 31 %
Lymphs Abs: 3.7 10*3/uL (ref 0.7–4.0)
MCH: 29.7 pg (ref 26.0–34.0)
MCHC: 34.5 g/dL (ref 30.0–36.0)
MCV: 86.2 fL (ref 80.0–100.0)
Monocytes Absolute: 0.5 10*3/uL (ref 0.1–1.0)
Monocytes Relative: 4 %
Neutro Abs: 7.5 10*3/uL (ref 1.7–7.7)
Neutrophils Relative %: 64 %
Platelets: 390 10*3/uL (ref 150–400)
RBC: 4.78 MIL/uL (ref 4.22–5.81)
RDW: 14.2 % (ref 11.5–15.5)
WBC: 11.9 10*3/uL — ABNORMAL HIGH (ref 4.0–10.5)
nRBC: 0 % (ref 0.0–0.2)

## 2020-11-18 LAB — COMPREHENSIVE METABOLIC PANEL
ALT: 23 U/L (ref 0–44)
AST: 23 U/L (ref 15–41)
Albumin: 3.7 g/dL (ref 3.5–5.0)
Alkaline Phosphatase: 64 U/L (ref 38–126)
Anion gap: 13 (ref 5–15)
BUN: 12 mg/dL (ref 6–20)
CO2: 26 mmol/L (ref 22–32)
Calcium: 9.3 mg/dL (ref 8.9–10.3)
Chloride: 102 mmol/L (ref 98–111)
Creatinine, Ser: 0.7 mg/dL (ref 0.61–1.24)
GFR, Estimated: >60 mL/min (ref >60)
Glucose, Bld: 115 mg/dL — ABNORMAL HIGH (ref 70–99)
Potassium: 3.2 mmol/L — ABNORMAL LOW (ref 3.5–5.1)
Sodium: 141 mmol/L (ref 135–145)
Total Bilirubin: 0.7 mg/dL (ref 0.3–1.2)
Total Protein: 7.3 g/dL (ref 6.5–8.1)

## 2020-11-18 LAB — RESP PANEL BY RT-PCR (FLU A&B, COVID) ARPGX2
Influenza A by PCR: NEGATIVE
Influenza B by PCR: NEGATIVE
SARS Coronavirus 2 by RT PCR: NEGATIVE

## 2020-11-18 LAB — POC SARS CORONAVIRUS 2 AG -  ED: SARS Coronavirus 2 Ag: NEGATIVE

## 2020-11-18 LAB — POC SARS CORONAVIRUS 2 AG: SARS Coronavirus 2 Ag: NEGATIVE

## 2020-11-18 LAB — ETHANOL: Alcohol, Ethyl (B): <10 mg/dL (ref <10)

## 2020-11-18 MED ORDER — METOPROLOL TARTRATE 25 MG PO TABS
25.0000 mg | ORAL_TABLET | Freq: Two times a day (BID) | ORAL | Status: DC
  Administered 2020-11-18 – 2020-11-19 (×2): 25 mg via ORAL
  Filled 2020-11-18 (×2): qty 1

## 2020-11-18 MED ORDER — ESCITALOPRAM OXALATE 10 MG PO TABS
20.0000 mg | ORAL_TABLET | Freq: Every day | ORAL | Status: DC
  Administered 2020-11-18 – 2020-11-19 (×2): 20 mg via ORAL
  Filled 2020-11-18 (×2): qty 2

## 2020-11-18 MED ORDER — OLANZAPINE 10 MG PO TABS
10.0000 mg | ORAL_TABLET | Freq: Every day | ORAL | Status: DC
  Administered 2020-11-18: 10 mg via ORAL
  Filled 2020-11-18: qty 1

## 2020-11-18 MED ORDER — APIXABAN 5 MG PO TABS
5.0000 mg | ORAL_TABLET | Freq: Two times a day (BID) | ORAL | Status: DC
  Administered 2020-11-18 – 2020-11-19 (×2): 5 mg via ORAL
  Filled 2020-11-18 (×2): qty 1

## 2020-11-18 MED ORDER — CYCLOBENZAPRINE HCL 10 MG PO TABS
10.0000 mg | ORAL_TABLET | Freq: Three times a day (TID) | ORAL | Status: DC | PRN
  Administered 2020-11-18: 10 mg via ORAL
  Filled 2020-11-18: qty 1

## 2020-11-18 MED ORDER — GABAPENTIN 400 MG PO CAPS
400.0000 mg | ORAL_CAPSULE | Freq: Three times a day (TID) | ORAL | Status: DC
  Administered 2020-11-18 – 2020-11-19 (×2): 400 mg via ORAL
  Filled 2020-11-18 (×2): qty 1

## 2020-11-18 MED ORDER — ASPIRIN EC 81 MG PO TBEC
81.0000 mg | DELAYED_RELEASE_TABLET | Freq: Every day | ORAL | Status: DC
  Administered 2020-11-18 – 2020-11-19 (×2): 81 mg via ORAL
  Filled 2020-11-18 (×2): qty 1
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
## 2020-11-18 NOTE — ED Provider Notes (Signed)
Behavioral Health Admission [facility] & OBS)  Date: 11/18/20 Patient Name: Michael Farrell. MRN: [id number] Chief Complaint: No chief complaint on file.  Chief Complaint/Presenting Problem: Pt presents with S/I  Diagnoses:  Final diagnoses:  Adjustment disorder with mixed anxiety and depressed mood  Severe episode of recurrent major depressive disorder, without psychotic features (HCC)  Amphetamine abuse ([place])  Polysubstance abuse (HCC)    HPI: * History of Present illness: Michael Farrell. is a 49 y.o. male patient presents to [facility] as a walk in with complaints of suicidal ideation.  Michael Farrell., 49 y.o., male patient seen face to face by this provider, consulted with Dr. Dwyane Dee; and chart reviewed on 11/18/20.  On evaluation Michael Farrell. reports he has been working with the [facility] to get into the motel that was recently opened for the homeless.  States today they told him he could not go because he did not have the paper work for his service dog.  Patient states when told he could not go he got up set and told them he was going to kill himself.  Patient states he has calmed down.  States while at [facility] he was also told to go to the [employer] who is also taking applications to get into the motel.   During evaluation Michael Farrell. is alert/oriented x 4; calm/cooperative; and mood is congruent with affect.  He does not appear to be responding to internal/external stimuli or delusional thoughts.  Patient denies suicidal/self-harm/homicidal ideation, psychosis, and paranoia.  Patient answered question appropriately.  When told he would not be spending the night; patient got upset because he was told he would be able to stay at facility for 23-24 hours.  Patient stating he is unable to get to his tent tonight and that it is cold outside.  Patient reports that he is able to independently perform his ADL with out assistance.   PHQ 2-9:    ED from 11/18/2020 in [facility]  Thoughts that you would be better off dead, or of hurting yourself in some way Nearly every day  [Phreesia 11/18/2020]  PHQ-9 Total Score 27        ED from 11/18/2020 in [facility] ED from 05/27/2020 in [place] DEPT Admission (Discharged) from OP Visit from 05/26/2020 in [place] High Risk High Risk High Risk       Total Time spent with patient: 30 minutes  Musculoskeletal  Musculoskeletal: Strength & Muscle Tone: AKA amputation right ext Gait & Station: Wheel chair Patient leans: N/A  Psychiatric Specialty Exam  Presentation General Appearance: Appropriate for Environment;Casual  Eye Contact:Good  Speech:Clear and Coherent;Normal Rate  Speech Volume:Normal  Handedness:Right   Mood and Affect  Mood:Anxious;Depressed  Affect:Appropriate;Congruent   Thought Process  Thought Processes:Coherent;Goal Directed  Descriptions of Associations:Intact  Orientation:Full (Time, Place and Person)  Thought Content:WDL  Hallucinations:Hallucinations: None  Ideas of Reference:None  Suicidal Thoughts:Suicidal Thoughts: No  Homicidal Thoughts:Homicidal Thoughts: No   Sensorium  Memory:No data recorded Judgment:No data recorded Insight:Present   Executive Functions  Concentration:Good  Attention Span:Good  [place]  Language:Good   Psychomotor Activity  Psychomotor Activity:Psychomotor Activity: Normal   Assets  Assets:Communication Skills;Desire for Improvement   Sleep  Sleep:Sleep: Good    Physical Exam: Physical Exam Vitals and nursing note reviewed.  Constitutional:      Appearance:  He is well-developed.  HENT:     Head: Normocephalic and atraumatic.  Eyes:     Conjunctiva/sclera: Conjunctivae normal.  Cardiovascular:     Rate and Rhythm: Normal rate and regular rhythm.      Heart sounds: No murmur heard.   Pulmonary:     Effort: Pulmonary effort is normal. No respiratory distress.     Breath sounds: Normal breath sounds.  Abdominal:     Palpations: Abdomen is soft.     Tenderness: There is no abdominal tenderness.  Musculoskeletal:     Cervical back: Neck supple.     Comments: Above knee amputation left leg.    Skin:    General: Skin is warm and dry.  Neurological:     Mental Status: He is alert.  Psychiatric:        Attention and Perception: Attention and perception normal. He does not perceive auditory or visual hallucinations.        Mood and Affect: Affect normal. Mood is anxious and depressed.        Speech: Speech normal.        Behavior: Behavior normal. Behavior is cooperative.        Thought Content: Thought content normal. Thought content is not paranoid or delusional. Thought content does not include homicidal or suicidal ideation.        Cognition and Memory: Cognition and memory normal.        Judgment: Judgment normal.   Review of Systems  Constitutional: Negative.   HENT: Negative.   Eyes: Negative.   Respiratory: Negative.   Cardiovascular: Negative.   Gastrointestinal: Negative.   Genitourinary: Negative.   Musculoskeletal: Negative.   Skin: Negative.   Neurological: Negative.   Endo/Heme/Allergies: Negative.   Psychiatric/Behavioral: Positive for substance abuse. Negative for hallucinations. Depression: Stable. Suicidal ideas: Denies at this time. The patient does not have insomnia. Nervous/anxious: Stable.   All other systems reviewed and are negative.  Blood pressure (!) 130/98, pulse 100, temperature 98.4 F (36.9 C), temperature source Tympanic, height 5' 7"  (1.702 m), weight 147 lb (66.7 kg), SpO2 97 %. Body mass index is 23.02 kg/m.  Past Psychiatric History: See above   Is the patient at risk to self? No  Has the patient been a risk to self in the past 6 months? No .    Has the patient been a risk to self within  the distant past? No   Is the patient a risk to others? No   Has the patient been a risk to others in the past 6 months? No   Has the patient been a risk to others within the distant past? No   Past Medical History:  Past Medical History:  Diagnosis Date  . Anxiety   . Bipolar disorder ([place])   . Depression   . Hypertension   . PTSD (post-traumatic stress disorder)   . Pulmonary embolus ([place])   . Schizophrenia [facility])     Past Surgical History:  Procedure Laterality Date  . CLAVICLE SURGERY     pt unsure whether it was left or right; has been broken "a couple times"  . LEG AMPUTATION      Family History:  Family History  Family history unknown: Yes    Social History:  Social History   Socioeconomic History  . Marital status: Married    Spouse name: Not on file  . Number of children: 0  . Years of education: Not on file  . Highest education level:  GED or equivalent  Occupational History  . Occupation: unemployed  Tobacco Use  . Smoking status: Current Every Day Smoker    Packs/day: 1.50    Years: 42.00    Pack years: 63.00    Types: Cigarettes  . Smokeless tobacco: Former Systems developer  . Tobacco comment: It has been hard to stop smoking  Vaping Use  . Vaping Use: Never used  Substance and Sexual Activity  . Alcohol use: Not Currently    Comment: 2 drinks in the past 6 months  . Drug use: Yes    Types: Methamphetamines    Comment: past thursday  . Sexual activity: Not Currently  Other Topics Concern  . Not on file  Social History Narrative  . Not on file   Social Determinants of Health   Financial Resource Strain:   . Difficulty of Paying Living Expenses: Not on file  Food Insecurity:   . Worried About Charity fundraiser in the Last Year: Not on file  . Ran Out of Food in the Last Year: Not on file  Transportation Needs:   . Lack of Transportation (Medical): Not on file  . Lack of Transportation (Non-Medical): Not on file  Physical Activity:   . Days of  Exercise per Week: Not on file  . Minutes of Exercise per Session: Not on file  Stress:   . Feeling of Stress : Not on file  Social Connections:   . Frequency of Communication with Friends and Family: Not on file  . Frequency of Social Gatherings with Friends and Family: Not on file  . Attends Religious Services: Not on file  . Active Member of Clubs or Organizations: Not on file  . Attends Archivist Meetings: Not on file  . Marital Status: Not on file  Intimate Partner Violence:   . Fear of Current or Ex-Partner: Not on file  . Emotionally Abused: Not on file  . Physically Abused: Not on file  . Sexually Abused: Not on file    SDOH:  SDOH Screenings   Alcohol Screen: Low Risk   . Last Alcohol Screening Score (AUDIT): 5  Depression (PHQ2-9): Medium Risk  . PHQ-2 Score: 27  Financial Resource Strain:   . Difficulty of Paying Living Expenses: Not on file  Food Insecurity:   . Worried About Charity fundraiser in the Last Year: Not on file  . Ran Out of Food in the Last Year: Not on file  Housing:   . Last Housing Risk Score: Not on file  Physical Activity:   . Days of Exercise per Week: Not on file  . Minutes of Exercise per Session: Not on file  Social Connections:   . Frequency of Communication with Friends and Family: Not on file  . Frequency of Social Gatherings with Friends and Family: Not on file  . Attends Religious Services: Not on file  . Active Member of Clubs or Organizations: Not on file  . Attends Archivist Meetings: Not on file  . Marital Status: Not on file  Stress:   . Feeling of Stress : Not on file  Tobacco Use: High Risk  . Smoking Tobacco Use: Current Every Day Smoker  . Smokeless Tobacco Use: Former Soil scientist Needs:   . Film/video editor (Medical): Not on file  . Lack of Transportation (Non-Medical): Not on file    Last Labs:  Admission on 09/01/2020, Discharged on 09/02/2020  Component Date Value Ref Range  Status  .  Sodium 09/01/2020 136  135 - 145 mmol/L Final  . Potassium 09/01/2020 3.2* 3.5 - 5.1 mmol/L Final  . Chloride 09/01/2020 104  98 - 111 mmol/L Final  . CO2 09/01/2020 22  22 - 32 mmol/L Final  . Glucose, Bld 09/01/2020 95  70 - 99 mg/dL Final   Glucose reference range applies only to samples taken after fasting for at least 8 hours.  . BUN 09/01/2020 11  6 - 20 mg/dL Final  . Creatinine, Ser 09/01/2020 0.84  0.61 - 1.24 mg/dL Final  . Calcium 09/01/2020 8.5* 8.9 - 10.3 mg/dL Final  . GFR calc non Af Amer 09/01/2020 >60  >60 mL/min Final  . GFR calc Af Amer 09/01/2020 >60  >60 mL/min Final  . Anion gap 09/01/2020 10  5 - 15 Final   Performed at [facility], [place] [address] [place], [place] [address]  . WBC 09/01/2020 14.7* 4.0 - 10.5 K/uL Final  . RBC 09/01/2020 4.85  4.22 - 5.81 MIL/uL Final  . Hemoglobin 09/01/2020 13.8  13.0 - 17.0 g/dL Final  . HCT 09/01/2020 44.4  39 - 52 % Final  . MCV 09/01/2020 91.5  80.0 - 100.0 fL Final  . MCH 09/01/2020 28.5  26.0 - 34.0 pg Final  . MCHC 09/01/2020 31.1  30.0 - 36.0 g/dL Final  . RDW 09/01/2020 13.7  11.5 - 15.5 % Final  . Platelets 09/01/2020 273  150 - 400 K/uL Final  . nRBC 09/01/2020 0.0  0.0 - 0.2 % Final   Performed at [facility], [place] [address] [place], [place] [address]  . Troponin I (High Sensitivity) 09/01/2020 3  <18 ng/L Final   Comment: (NOTE) Elevated high sensitivity troponin I (hsTnI) values and significant  changes across serial measurements may suggest ACS but many other  chronic and acute conditions are known to elevate hsTnI results.  Refer to the "Links" section for chest pain algorithms and additional  guidance. Performed at [facility], [place] [address], [place], [place] [address]   . Prothrombin Time 09/01/2020 13.2  11.4 - 15.2 seconds Final  . INR 09/01/2020 1.0  0.8 - 1.2 Final   Comment: (NOTE) INR goal varies based on device and disease states. Performed at [place], [place] [address], [place], [place] [address]   . Troponin I (High Sensitivity) 09/01/2020 4  <18 ng/L Final   Comment: (NOTE) Elevated high sensitivity troponin I (hsTnI) values and significant  changes across serial measurements may suggest ACS but many other  chronic and acute conditions are known to elevate hsTnI results.  Refer to the "Links" section for chest pain algorithms and additional  guidance. Performed at [facility], [place] [address] [place], [place] [address]   . SARS Coronavirus 2 09/01/2020 POSITIVE* NEGATIVE Final   Comment: RESULT CALLED TO, READ BACK BY AND VERIFIED WITH: RN Richarda Blade [id number] FCP (NOTE) SARS-CoV-2 target nucleic acids are DETECTED  SARS-CoV-2 RNA is generally detectable in upper respiratory specimens  during the acute phase of infection.  Positive results are indicative  of the presence of the identified virus, but do not rule out bacterial infection or co-infection with other pathogens not detected by the test.  Clinical correlation with patient history and  other diagnostic information is necessary to determine patient infection status.  The expected result is negative.  Fact Sheet for Patients:   StrictlyIdeas.no   Fact Sheet for Healthcare Providers:  BankingDealers.co.za    This test is not yet approved or cleared by the [place] and  has been authorized for detection and/or diagnosis of SARS-CoV-2 by FDA under an Emergency Use Authorization (EUA).  This EUA will remain in effect (meaning this test can                           be used) for the duration of  the COVID-19 declaration under Section 564(b)(1) of the Act, 21 U.S.C. section 360-bbb-3(b)(1), unless the authorization is terminated or revoked sooner.  Performed at [facility], [place] [address] [place], [place] [address]   . Total Protein 09/02/2020 7.0  6.5 - 8.1 g/dL Final  . Albumin 09/02/2020 3.4* 3.5  - 5.0 g/dL Final  . AST 09/02/2020 39  15 - 41 U/L Final  . ALT 09/02/2020 43  0 - 44 U/L Final  . Alkaline Phosphatase 09/02/2020 77  38 - 126 U/L Final  . Total Bilirubin 09/02/2020 0.4  0.3 - 1.2 mg/dL Final  . Bilirubin, Direct 09/02/2020 0.1  0.0 - 0.2 mg/dL Final  . Indirect Bilirubin 09/02/2020 0.3  0.3 - 0.9 mg/dL Final   Performed at [place] [address] [place], [place] [address]  . Lipase 09/02/2020 36  11 - 51 U/L Final   Performed at [facility], [place] [address], [place], [place] [address]  Admission on 05/27/2020, Discharged on 05/27/2020  Component Date Value Ref Range Status  . Sodium 05/27/2020 141  135 - 145 mmol/L Final  . Potassium 05/27/2020 3.8  3.5 - 5.1 mmol/L Final  . Chloride 05/27/2020 106  98 - 111 mmol/L Final  . CO2 05/27/2020 23  22 - 32 mmol/L Final  . Glucose, Bld 05/27/2020 120* 70 - 99 mg/dL Final   Glucose reference range applies only to samples taken after fasting for at least 8 hours.  . BUN 05/27/2020 15  6 - 20 mg/dL Final  . Creatinine, Ser 05/27/2020 0.74  0.61 - 1.24 mg/dL Final  . Calcium 05/27/2020 8.5* 8.9 - 10.3 mg/dL Final  . Total Protein 05/27/2020 6.7  6.5 - 8.1 g/dL Final  . Albumin 05/27/2020 3.7  3.5 - 5.0 g/dL Final  . AST 05/27/2020 25  15 - 41 U/L Final  . ALT 05/27/2020 32  0 - 44 U/L Final  . Alkaline Phosphatase 05/27/2020 81  38 - 126 U/L Final  . Total Bilirubin 05/27/2020 0.3  0.3 - 1.2 mg/dL Final  . GFR calc non Af Amer 05/27/2020 >60  >60 mL/min Final  . GFR calc Af Amer 05/27/2020 >60  >60 mL/min Final  . Anion gap 05/27/2020 12  5 - 15 Final   Performed at [facility], [place] [address], [place], [place] [address]  . Alcohol, Ethyl (B) 05/27/2020 <10  <10 mg/dL Final   Comment: (NOTE) Lowest detectable limit for serum alcohol is 10 mg/dL.  For medical purposes only. Performed at [facility], [place] [address] [place], [place] [address]   . Salicylate Lvl  53/61/4431 <7.0* 7.0 - 30.0 mg/dL Final   Performed at [place] [address] [place], [place] [address]  . Acetaminophen (Tylenol), Serum 05/27/2020 14  10 - 30 ug/mL Final   Comment: (NOTE) Therapeutic concentrations vary significantly. A range of 10-30 ug/mL  may be an effective concentration for many patients. However, some  are best treated  at concentrations outside of this range. Acetaminophen concentrations >150 ug/mL at 4 hours after ingestion  and >50 ug/mL at 12 hours after ingestion are often associated with  toxic reactions.  Performed at [facility], [place] [address], [place], [place] [address]   . WBC 05/27/2020 14.8* 4.0 - 10.5 K/uL Final  . RBC 05/27/2020 4.68  4.22 - 5.81 MIL/uL Final  . Hemoglobin 05/27/2020 13.4  13.0 - 17.0 g/dL Final  . HCT 05/27/2020 42.3  39 - 52 % Final  . MCV 05/27/2020 90.4  80.0 - 100.0 fL Final  . MCH 05/27/2020 28.6  26.0 - 34.0 pg Final  . MCHC 05/27/2020 31.7  30.0 - 36.0 g/dL Final  . RDW 05/27/2020 15.5  11.5 - 15.5 % Final  . Platelets 05/27/2020 306  150 - 400 K/uL Final  . nRBC 05/27/2020 0.0  0.0 - 0.2 % Final   Performed at [facility], [place] [address], [place],  [address]  Admission on 05/26/2020, Discharged on 05/26/2020  Component Date Value Ref Range Status  . SARS Coronavirus 2 05/26/2020 NEGATIVE  NEGATIVE Final   Comment: (NOTE) SARS-CoV-2 target nucleic acids are NOT DETECTED.  The SARS-CoV-2 RNA is generally detectable in upper and lower respiratory specimens during the acute phase of infection. The lowest concentration of SARS-CoV-2 viral copies this assay can detect is 250 copies / mL. A negative result does not preclude SARS-CoV-2 infection and should not be used as the sole basis for treatment or other patient management decisions.  A negative result may occur with improper specimen collection / handling, submission of specimen other than  nasopharyngeal swab, presence of viral mutation(s) within the areas targeted by this assay, and inadequate number of viral copies (<250 copies / mL). A negative result must be combined with clinical observations, patient history, and epidemiological information.  Fact Sheet for Patients:   StrictlyIdeas.no  Fact Sheet for Healthcare Providers: BankingDealers.co.za  This test is not yet approved or                           cleared by the [place] FDA and has been authorized for detection and/or diagnosis of SARS-CoV-2 by FDA under an Emergency Use Authorization (EUA).  This EUA will remain in effect (meaning this test can be used) for the duration of the COVID-19 declaration under Section 564(b)(1) of the Act, 21 U.S.C. section 360bbb-3(b)(1), unless the authorization is terminated or revoked sooner.  Performed at [facility], [place] [address], [place],  [address]   Admission on 05/19/2020, Discharged on 05/20/2020  Component Date Value Ref Range Status  . Sodium 05/20/2020 140  135 - 145 mmol/L Final  . Potassium 05/20/2020 4.0  3.5 - 5.1 mmol/L Final  . Chloride 05/20/2020 104  98 - 111 mmol/L Final  . CO2 05/20/2020 26  22 - 32 mmol/L Final  . Glucose, Bld 05/20/2020 106* 70 - 99 mg/dL Final   Glucose reference range applies only to samples taken after fasting for at least 8 hours.  . BUN 05/20/2020 12  6 - 20 mg/dL Final  . Creatinine, Ser 05/20/2020 0.64  0.61 - 1.24 mg/dL Final  . Calcium 05/20/2020 8.7* 8.9 - 10.3 mg/dL Final  . GFR calc non Af Amer 05/20/2020 >60  >60 mL/min Final  . GFR calc Af Amer 05/20/2020 >60  >60 mL/min Final  . Anion gap 05/20/2020 10  5 - 15 Final  Performed at [facility], [place] [address] [place], [place] [address]  . WBC 05/20/2020 26.1* 4.0 - 10.5 K/uL Final  . RBC 05/20/2020 4.73  4.22 - 5.81 MIL/uL Final  . Hemoglobin 05/20/2020 13.9  13.0 -  17.0 g/dL Final  . HCT 05/20/2020 42.4  39 - 52 % Final  . MCV 05/20/2020 89.6  80.0 - 100.0 fL Final  . MCH 05/20/2020 29.4  26.0 - 34.0 pg Final  . MCHC 05/20/2020 32.8  30.0 - 36.0 g/dL Final  . RDW 05/20/2020 14.1  11.5 - 15.5 % Final  . Platelets 05/20/2020 320  150 - 400 K/uL Final  . nRBC 05/20/2020 0.0  0.0 - 0.2 % Final   Performed at [facility], [place] [address], [place], [place] [address]  . Troponin I (High Sensitivity) 05/20/2020 3  <18 ng/L Final   Comment: (NOTE) Elevated high sensitivity troponin I (hsTnI) values and significant  changes across serial measurements may suggest ACS but many other  chronic and acute conditions are known to elevate hsTnI results.  Refer to the "Links" section for chest pain algorithms and additional  guidance. Performed at [facility], [place] [address] [place], [place] [address]   . Troponin I (High Sensitivity) 05/20/2020 3  <18 ng/L Final   Comment: (NOTE) Elevated high sensitivity troponin I (hsTnI) values and significant  changes across serial measurements may suggest ACS but many other  chronic and acute conditions are known to elevate hsTnI results.  Refer to the "Links" section for chest pain algorithms and additional  guidance. Performed at [facility], [place] [address] [place], [place] [address]     Allergies: Patient has no known allergies.  PTA Medications: (Not in a hospital admission)   Medical Decision Making  Patient admitted to continuous assessment Home medications restarted Meds ordered this encounter  Medications  . aspirin EC tablet 81 mg  . apixaban (ELIQUIS) tablet 5 mg  . cyclobenzaprine (FLEXERIL) tablet 10 mg  . escitalopram (LEXAPRO) tablet 20 mg  . gabapentin (NEURONTIN) capsule 400 mg  . metoprolol tartrate (LOPRESSOR) tablet 25 mg  . OLANZapine (ZYPREXA) tablet 10 mg   Labs ordered  Lab Orders     Resp Panel by RT-PCR (Flu A&B, Covid)  Nasopharyngeal Swab     Ethanol     Comprehensive metabolic panel     CBC with Differential/Platelet     POC SARS Coronavirus 2 Ag-ED - Nasal Swab (BD Veritor Kit)   Peer support order  Recommendations  Based on my evaluation the patient does not appear to have an emergency medical condition.  Raveen Wieseler, NP 11/18/20  6:47 PM

## 2020-11-18 NOTE — ED Triage Notes (Signed)
Presents with suicidal ideations, plan to take medications.  Pt is homeless and girlfriend broke up with him and he lost his service dog.

## 2020-11-18 NOTE — ED Provider Notes (Signed)
Behavioral Health Urgent Care Medical Screening Exam  Patient Name: Michael Farrell. MRN: [id number] Date of Evaluation: 11/18/20 Chief Complaint:   Diagnosis:  Final diagnoses:  Adjustment disorder with mixed anxiety and depressed mood  Severe episode of recurrent major depressive disorder, without psychotic features (HCC)  Amphetamine abuse (HCC)  Polysubstance abuse (HCC)    History of Present illness: Michael Derks. is a 49 y.o. male patient presents to [facility] as a walk in with complaints of suicidal ideation.  Michael Billet., 50 y.o., male patient seen face to face by this provider, consulted with Dr. Lucianne Muss; and chart reviewed on 11/18/20.  On evaluation Michael Yniguez. reports he has been working with the [facility] to get into the motel that was recently opened for the homeless.  States today they told him he could not go because he did not have the paper work for his service dog.  Patient states when told he could not go he got up set and told them he was going to kill himself.  Patient states he has calmed down.  States while at [facility] he was also told to go to the [employer] who is also taking applications to get into the motel.   During evaluation Michael Billet. is alert/oriented x 4; calm/cooperative; and mood is congruent with affect.  He does not appear to be responding to internal/external stimuli or delusional thoughts.  Patient denies suicidal/self-harm/homicidal ideation, psychosis, and paranoia.  Patient answered question appropriately.  When told he would not be spending the night; patient got upset because he was told he would be able to stay at facility for 23-24 hours.  Patient stating he is unable to get to his tent tonight and that it is cold outside.    Psychiatric Specialty Exam  Presentation  General Appearance:Appropriate for Environment;Casual  Eye Contact:Good  Speech:Clear and Coherent;Normal Rate  Speech  Volume:Normal  Handedness:Right   Mood and Affect  Mood:Anxious;Depressed  Affect:Appropriate;Congruent   Thought Process  Thought Processes:Coherent;Goal Directed  Descriptions of Associations:Intact  Orientation:Full (Time, Place and Person)  Thought Content:WDL  Hallucinations:None  Ideas of Reference:None  Suicidal Thoughts:No  Homicidal Thoughts:No   Sensorium  Memory:No data recorded Judgment:No data recorded Insight:Present   Executive Functions  Concentration:Good  Attention Span:Good  Recall:Good  Fund of Knowledge:Fair  Language:Good   Psychomotor Activity  Psychomotor Activity:Normal   Assets  Assets:Communication Skills;Desire for Improvement   Sleep  Sleep:Good  Number of hours: No data recorded  Physical Exam: Physical Exam Vitals and nursing note reviewed.  Constitutional:      Appearance: He is well-developed.  HENT:     Head: Normocephalic and atraumatic.  Eyes:     Conjunctiva/sclera: Conjunctivae normal.  Cardiovascular:     Rate and Rhythm: Normal rate and regular rhythm.     Heart sounds: No murmur heard.   Pulmonary:     Effort: Pulmonary effort is normal. No respiratory distress.     Breath sounds: Normal breath sounds.  Abdominal:     Palpations: Abdomen is soft.     Tenderness: There is no abdominal tenderness.  Musculoskeletal:     Cervical back: Neck supple.     Comments: Above knee amputation left leg.    Skin:    General: Skin is warm and dry.  Neurological:     Mental Status: He is alert.  Psychiatric:        Attention and Perception: Attention and perception normal. He does  not perceive auditory or visual hallucinations.        Mood and Affect: Affect normal. Mood is anxious and depressed.        Speech: Speech normal.        Behavior: Behavior normal. Behavior is cooperative.        Thought Content: Thought content normal. Thought content is not paranoid or delusional. Thought content does not  include homicidal or suicidal ideation.        Cognition and Memory: Cognition and memory normal.        Judgment: Judgment normal.    Review of Systems  Constitutional: Negative.   HENT: Negative.   Eyes: Negative.   Respiratory: Negative.   Cardiovascular: Negative.   Gastrointestinal: Negative.   Genitourinary: Negative.   Musculoskeletal: Negative.   Skin: Negative.   Neurological: Negative.   Endo/Heme/Allergies: Negative.   Psychiatric/Behavioral: Positive for substance abuse. Negative for hallucinations. Depression: Stable. Suicidal ideas: Denies at this time. The patient does not have insomnia. Nervous/anxious: Stable.   All other systems reviewed and are negative.  Blood pressure (!) 130/98, pulse 100, temperature 98.4 F (36.9 C), temperature source Tympanic, height 5\' 7"  (1.702 m), weight 147 lb (66.7 kg), SpO2 97 %. Body mass index is 23.02 kg/m.  Musculoskeletal: Strength & Muscle Tone: AKA amputation right ext Gait & Station: Wheel chair Patient leans: N/A   [facility] MSE Discharge Disposition for Follow up and Recommendations: Based on my evaluation the patient does not appear to have an emergency medical condition and can be discharged with resources and follow up care in outpatient services for Medication Management and Individual Therapy   Disposition: Will admit to continuous assessment to allow social work and peer support to set up for outpatient psychiatric services and substance use services.      Michael Santino, NP 11/18/2020, 5:58 PM

## 2020-11-18 NOTE — Progress Notes (Signed)
Received Michael Farrell at the [facility] with a chief c/o of SI after his girlfriend broke up with him. He stated a tentative plan to take a knife to his throat. He has a PHX of PTSD, bipolar and schizophrenia. 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

## 2020-11-19 ENCOUNTER — Encounter (HOSPITAL_COMMUNITY): Payer: Self-pay | Admitting: Registered Nurse

## 2020-11-19 NOTE — ED Notes (Signed)
Given  breakfast

## 2020-11-19 NOTE — ED Notes (Signed)
No belongings sheet upon admission. Completed on 12-07 at 0816am

## 2020-11-19 NOTE — ED Notes (Signed)
Patient alert and oriented X 4, denies pain this am and SI/HI and AVH. Patient cooperative and flat effect. Patient has no objective signs of discomfort or crisis at this time. Patient received scheduled medications. Staff will continue to monitor.

## 2020-11-19 NOTE — ED Notes (Signed)
Given lunch

## 2020-11-19 NOTE — Progress Notes (Signed)
Patient resting peacefully in bed, eyes closed, respirations are even and unlabored at this time. No objective signs of distress. Staff will continue to monitor for safety.

## 2020-11-19 NOTE — Discharge Summary (Signed)
Michael Farrell. to be D/C'd Home per MD order.  Discussed with the patient and all questions fully answered.  VSS, Skin clean, dry and intact without evidence of skin break down, no evidence of skin tears noted.  An After Visit Summary was printed and given to the patient. Patient received prescription.  D/c education completed with patient/family including follow up instructions, medication list, d/c activities limitations if indicated, with other d/c instructions as indicated by MD - patient able to verbalize understanding, all questions fully answered.   Patient instructed to return to ED, call 911, or call MD for any changes in condition.   Patient transferred via Safe transport.   [name] 11/19/2020 2:56 PM

## 2020-11-19 NOTE — ED Provider Notes (Addendum)
FBC/OBS ASAP Discharge Summary  Date and Time: 11/19/2020 11:35 AM  Name: Michael Farrell.  MRN:  [id number]   Discharge Diagnoses:  Final diagnoses:  Adjustment disorder with mixed anxiety and depressed mood  Severe episode of recurrent major depressive disorder, without psychotic features (HCC)  Amphetamine abuse (HCC)  Polysubstance abuse (HCC)    Subjective:  Michael Billet., 49 y.o., male patient seen via telepsych by TTS and this provider; chart reviewed and consulted with Dr. Lucianne Muss on 11/19/20.  On evaluation Michael Weida. reports he slept well last night.  State that he will follow up with the [facility] and [employer] today related to the motel for the homeless.  States he has all of his paper work and the information needed for his services dog.  During evaluation Michael Billet. is sitting on side of bed.  He is alert/oriented x 4; calm/cooperative; and mood congruent with affect.  Patient is speaking in a clear tone at moderate volume, and normal pace; with good eye contact.  His thought process is coherent and relevant; There is no indication that he is currently responding to internal/external stimuli or experiencing delusional thought content.  Patient denies suicidal/self-harm/homicidal ideation, psychosis, and paranoia.  Patient has remained calm throughout assessment and has answered questions appropriately.   Stay Summary:   Michael Betzold. was admitted to [facility] Continuous Assessment unit for Adjustment disorder with mixed anxiety and depressed mood and crisis management.  He was restarted on his home medications with no changes during this stay.  Reporting that he tolerated medications with no adverse reaction.  Patient also discharged with current medication as noted below (details listed below under Medication List).    Michael Spell Waldeck Jr.'s improvement was monitored by continuous assessment/observation and his report of symptom reduction.  His  emotional and mental status was also monitored by staff.           Michael Billet. was evaluated for stability and plans for continued recovery upon discharge.  Michael Billet. motivation was an integral factor for scheduling further treatment.  The following was addressed as part of his discharge planning and follow up treatment:  Employment, housing, transportation, bed availability, health status, family support, and any pending legal issues were also considered during his during the continuous assessment/observation.  He was offered further treatment options upon discharge including but not limited to Residential, Intensive Outpatient, Outpatient treatment, Rehabilitation services, and resources for shelters and Half-way-house if needed.  Michael Billet. will follow up with the services as listed below under Follow up Information.    Upon completion of this admission the [facility] was both mentally and medically stable for discharge denying suicidal/homicidal ideation, auditory/visual/tactile hallucinations, delusional thoughts and paranoia.     Total Time spent with patient: 45 minutes  Past Psychiatric History: See above Past Medical History:  Past Medical History:  Diagnosis Date  . Anxiety   . Bipolar disorder (HCC)   . Depression   . Hypertension   . PTSD (post-traumatic stress disorder)   . Pulmonary embolus (HCC)   . Schizophrenia [facility])     Past Surgical History:  Procedure Laterality Date  . CLAVICLE SURGERY     pt unsure whether it was left or right; has been broken "a couple times"  . LEG AMPUTATION     Family History:  Family History  Family history unknown: Yes   Family Psychiatric History:  Denies Social History:  Social History   Substance and Sexual Activity  Alcohol Use Not Currently   Comment: 2 drinks in the past 6 months     Social History   Substance and Sexual Activity  Drug Use Yes  . Types: Methamphetamines   Comment: past  thursday    Social History   Socioeconomic History  . Marital status: Married    Spouse name: Not on file  . Number of children: 0  . Years of education: Not on file  . Highest education level: GED or equivalent  Occupational History  . Occupation: unemployed  Tobacco Use  . Smoking status: Current Every Day Smoker    Packs/day: 1.50    Years: 42.00    Pack years: 63.00    Types: Cigarettes  . Smokeless tobacco: Former Neurosurgeon  . Tobacco comment: It has been hard to stop smoking  Vaping Use  . Vaping Use: Never used  Substance and Sexual Activity  . Alcohol use: Not Currently    Comment: 2 drinks in the past 6 months  . Drug use: Yes    Types: Methamphetamines    Comment: past thursday  . Sexual activity: Not Currently  Other Topics Concern  . Not on file  Social History Narrative  . Not on file   Social Determinants of Health   Financial Resource Strain:   . Difficulty of Paying Living Expenses: Not on file  Food Insecurity:   . Worried About Programme researcher, broadcasting/film/video in the Last Year: Not on file  . Ran Out of Food in the Last Year: Not on file  Transportation Needs:   . Lack of Transportation (Medical): Not on file  . Lack of Transportation (Non-Medical): Not on file  Physical Activity:   . Days of Exercise per Week: Not on file  . Minutes of Exercise per Session: Not on file  Stress:   . Feeling of Stress : Not on file  Social Connections:   . Frequency of Communication with Friends and Family: Not on file  . Frequency of Social Gatherings with Friends and Family: Not on file  . Attends Religious Services: Not on file  . Active Member of Clubs or Organizations: Not on file  . Attends Banker Meetings: Not on file  . Marital Status: Not on file   SDOH:  SDOH Screenings   Alcohol Screen: Low Risk   . Last Alcohol Screening Score (AUDIT): 5  Depression (PHQ2-9): Medium Risk  . PHQ-2 Score: 27  Financial Resource Strain:   . Difficulty of Paying  Living Expenses: Not on file  Food Insecurity:   . Worried About Programme researcher, broadcasting/film/video in the Last Year: Not on file  . Ran Out of Food in the Last Year: Not on file  Housing:   . Last Housing Risk Score: Not on file  Physical Activity:   . Days of Exercise per Week: Not on file  . Minutes of Exercise per Session: Not on file  Social Connections:   . Frequency of Communication with Friends and Family: Not on file  . Frequency of Social Gatherings with Friends and Family: Not on file  . Attends Religious Services: Not on file  . Active Member of Clubs or Organizations: Not on file  . Attends Banker Meetings: Not on file  . Marital Status: Not on file  Stress:   . Feeling of Stress : Not on file  Tobacco Use: High Risk  .  Smoking Tobacco Use: Current Every Day Smoker  . Smokeless Tobacco Use: Former Dispensing opticianUser  Transportation Needs:   . Freight forwarderLack of Transportation (Medical): Not on file  . Lack of Transportation (Non-Medical): Not on file    Has this patient used any form of tobacco in the last 30 days? (Cigarettes, Smokeless Tobacco, Cigars, and/or Pipes) A prescription for an FDA-approved tobacco cessation medication was offered at discharge and the patient refused  Current Medications:  Current Facility-Administered Medications  Medication Dose Route Frequency Provider Last Rate Last Admin  . apixaban (ELIQUIS) tablet 5 mg  5 mg Oral BID Ailyne Pawley B, NP   5 mg at 11/19/20 0905  . aspirin EC tablet 81 mg  81 mg Oral Daily Dionte Blaustein B, NP   81 mg at 11/19/20 0905  . cyclobenzaprine (FLEXERIL) tablet 10 mg  10 mg Oral TID PRN Aviv Lengacher B, NP   10 mg at 11/18/20 2136  . escitalopram (LEXAPRO) tablet 20 mg  20 mg Oral Daily Angell Honse B, NP   20 mg at 11/19/20 0905  . gabapentin (NEURONTIN) capsule 400 mg  400 mg Oral TID Dashawn Bartnick B, NP   400 mg at 11/19/20 0905  . metoprolol tartrate (LOPRESSOR) tablet 25 mg  25 mg Oral BID Jiovanni Heeter B, NP   25 mg at  11/19/20 0905  . OLANZapine (ZYPREXA) tablet 10 mg  10 mg Oral QHS Elantra Caprara B, NP   10 mg at 11/18/20 2137   Current Outpatient Medications  Medication Sig Dispense Refill  . apixaban (ELIQUIS) 5 MG TABS tablet Take 1 tablet (5 mg total) by mouth 2 (two) times daily. 60 tablet 0  . aspirin EC 81 MG tablet Take 1 tablet (81 mg total) by mouth daily. 30 tablet 0  . cyclobenzaprine (FLEXERIL) 10 MG tablet Take 1 tablet (10 mg total) by mouth 3 (three) times daily as needed for muscle spasms. 30 tablet 0  . escitalopram (LEXAPRO) 20 MG tablet Take 1 tablet (20 mg total) by mouth daily. For depression 30 tablet 0  . gabapentin (NEURONTIN) 400 MG capsule Take 1 capsule (400 mg total) by mouth 3 (three) times daily. For agitation 45 capsule 0  . hydrOXYzine (ATARAX/VISTARIL) 25 MG tablet Take 1 tablet (25 mg total) by mouth every 8 (eight) hours as needed for anxiety. 45 tablet 0  . metoprolol tartrate (LOPRESSOR) 25 MG tablet Take 1 tablet (25 mg total) by mouth 2 (two) times daily. For high blood pressure 60 tablet 0  . OLANZapine (ZYPREXA) 10 MG tablet Take 1 tablet (10 mg total) by mouth at bedtime. 30 tablet 0    PTA Medications: (Not in a hospital admission)   Musculoskeletal  Strength & Muscle Tone: Above Knee Amputation Gait & Station: Wheel chair Patient leans: N/A  Psychiatric Specialty Exam  Presentation  General Appearance: Appropriate for Environment;Casual  Eye Contact:Good  Speech:Clear and Coherent;Normal Rate  Speech Volume:Normal  Handedness:Right   Mood and Affect  Mood:Euthymic  Affect:Appropriate;Congruent   Thought Process  Thought Processes:Coherent;Goal Directed  Descriptions of Associations:Intact  Orientation:Full (Time, Place and Person)  Thought Content:WDL  Hallucinations:Hallucinations: None  Ideas of Reference:None  Suicidal Thoughts:Suicidal Thoughts: No  Homicidal Thoughts:Homicidal Thoughts: No   Sensorium  Memory:Immediate  Good;Recent Good  Judgment:Intact  Insight:Present   Executive Functions  Concentration:Good  Attention Span:Good  Recall:Good  Fund of Knowledge:Good  Language:Good   Psychomotor Activity  Psychomotor Activity:Psychomotor Activity: Normal   Assets  Assets:Communication Skills;Desire for Improvement  Sleep  Sleep:Sleep: Good   Physical Exam  Physical Exam Vitals and nursing note reviewed.  Constitutional:      Appearance: He is well-developed.  HENT:     Head: Normocephalic and atraumatic.  Eyes:     Conjunctiva/sclera: Conjunctivae normal.  Cardiovascular:     Rate and Rhythm: Normal rate and regular rhythm.     Heart sounds: No murmur heard.   Pulmonary:     Effort: Pulmonary effort is normal. No respiratory distress.     Breath sounds: Normal breath sounds.  Abdominal:     Palpations: Abdomen is soft.     Tenderness: There is no abdominal tenderness.  Musculoskeletal:     Cervical back: Neck supple.  Skin:    General: Skin is warm and dry.  Neurological:     Mental Status: He is alert.  Psychiatric:        Mood and Affect: Mood normal.        Behavior: Behavior normal.        Thought Content: Thought content normal.        Judgment: Judgment normal.    Review of Systems  Constitutional: Negative.   HENT: Negative.   Eyes: Negative.   Respiratory: Negative.   Cardiovascular: Negative.   Gastrointestinal: Negative.   Genitourinary: Negative.   Musculoskeletal: Negative.   Skin: Negative.   Neurological: Negative.   Endo/Heme/Allergies: Negative.   Psychiatric/Behavioral: Positive for substance abuse. Negative for hallucinations, memory loss and suicidal ideas. Depression: Stable. The patient does not have insomnia. Nervous/anxious: Stable.    Blood pressure 113/82, pulse 88, temperature 98.4 F (36.9 C), temperature source Oral, resp. rate 18, height 5\' 7"  (1.702 m), weight 147 lb (66.7 kg), SpO2 97 %. Body mass index is 23.02  kg/m.  Demographic Factors:  Male, Caucasian and Low socioeconomic status  Loss Factors: Homeless  Historical Factors: NA  Risk Reduction Factors:   Religious beliefs about death  Continued Clinical Symptoms:  Alcohol/Substance Abuse/Dependencies  Cognitive Features That Contribute To Risk:  None    Suicide Risk:  Minimal: No identifiable suicidal ideation.  Patients presenting with no risk factors but with morbid ruminations; may be classified as minimal risk based on the severity of the depressive symptoms  Plan Of Care/Follow-up recommendations:  Activity:  As tolerated Diet:  Heart healthy   Follow-up Information    Go to  [facility].   Specialty: Urgent Care Why: As a walk in Monday - Friday for Open Access (medication management and Therapy).  Can also ask about the [facility] IOP program Contact information: [address] [place] [phone]       [facility]. Go on 12/11/2020.   Specialty: Urgent Care Why: Your initial appointment 12/11/20 at 11:00am with Shaleta. If you cannot make this appointment, walk-in for intake is on Thursdays from 11:00am-3:00pm. Be sure to have any discharge paperwork available.  Contact information: [phone] [place] [phone]              Disposition: No evidence of imminent risk to self or others at present.   Patient does not meet criteria for psychiatric inpatient admission. Supportive therapy provided about ongoing stressors. Refer to IOP. Discussed crisis plan, support from social network, calling 911, coming to the Emergency Department, and calling Suicide Hotline.  Tanija Germani, NP 11/19/2020, 11:35 AM

## 2020-11-19 NOTE — ED Notes (Signed)
Pt sleeping at present, no distress noted, monitoring for safety. 

## 2020-12-03 ENCOUNTER — Telehealth (HOSPITAL_COMMUNITY): Payer: Self-pay | Admitting: *Deleted

## 2020-12-03 NOTE — Telephone Encounter (Signed)
Care Management - Follow Up [facility] Discharges   Writer made contact with the patient.  Patient has a follow up appointment scheduled on 12-11-2020 with the [facility] program

## 2020-12-11 ENCOUNTER — Ambulatory Visit (HOSPITAL_COMMUNITY): Payer: Medicaid Other | Admitting: Behavioral Health

## 2021-02-11 ENCOUNTER — Other Ambulatory Visit: Payer: Self-pay

## 2021-02-11 ENCOUNTER — Ambulatory Visit: Payer: Medicaid Other | Attending: Family Medicine | Admitting: Family Medicine

## 2021-02-11 ENCOUNTER — Other Ambulatory Visit: Payer: Self-pay | Admitting: Family Medicine

## 2021-02-11 ENCOUNTER — Encounter: Payer: Self-pay | Admitting: Family Medicine

## 2021-02-11 VITALS — BP 102/65 | HR 98

## 2021-02-11 DIAGNOSIS — F250 Schizoaffective disorder, bipolar type: Secondary | ICD-10-CM | POA: Insufficient documentation

## 2021-02-11 DIAGNOSIS — Z7982 Long term (current) use of aspirin: Secondary | ICD-10-CM | POA: Diagnosis not present

## 2021-02-11 DIAGNOSIS — Z89611 Acquired absence of right leg above knee: Secondary | ICD-10-CM | POA: Diagnosis not present

## 2021-02-11 DIAGNOSIS — I10 Essential (primary) hypertension: Secondary | ICD-10-CM | POA: Diagnosis not present

## 2021-02-11 DIAGNOSIS — Z86718 Personal history of other venous thrombosis and embolism: Secondary | ICD-10-CM | POA: Diagnosis not present

## 2021-02-11 DIAGNOSIS — I2699 Other pulmonary embolism without acute cor pulmonale: Secondary | ICD-10-CM | POA: Diagnosis not present

## 2021-02-11 DIAGNOSIS — Z7901 Long term (current) use of anticoagulants: Secondary | ICD-10-CM | POA: Diagnosis not present

## 2021-02-11 DIAGNOSIS — E876 Hypokalemia: Secondary | ICD-10-CM | POA: Diagnosis present

## 2021-02-11 DIAGNOSIS — Z79899 Other long term (current) drug therapy: Secondary | ICD-10-CM | POA: Diagnosis not present

## 2021-02-11 MED ORDER — ESCITALOPRAM OXALATE 20 MG PO TABS: 20.0000 mg | ORAL_TABLET | Freq: Every day | ORAL | 6 refills | Status: DC

## 2021-02-11 MED ORDER — MISC. DEVICES MISC: 0 refills | Status: AC

## 2021-02-11 MED ORDER — HYDROXYZINE HCL 25 MG PO TABS: 25.0000 mg | ORAL_TABLET | Freq: Three times a day (TID) | ORAL | 6 refills | Status: DC | PRN

## 2021-02-11 MED ORDER — GABAPENTIN 400 MG PO CAPS: 400.0000 mg | ORAL_CAPSULE | Freq: Three times a day (TID) | ORAL | 6 refills | Status: AC

## 2021-02-11 MED ORDER — APIXABAN 5 MG PO TABS: 5.0000 mg | ORAL_TABLET | Freq: Two times a day (BID) | ORAL | 0 refills | Status: DC

## 2021-02-11 MED ORDER — METOPROLOL TARTRATE 25 MG PO TABS: 12.5000 mg | ORAL_TABLET | Freq: Two times a day (BID) | ORAL | 6 refills | Status: AC

## 2021-02-11 MED FILL — ESCITALOPRAM 20 MG TABLET: 20 | 30 days supply | Qty: 30 | Fill #0

## 2021-02-11 MED FILL — hydrOXYzine HCL 25 MG TABS: 25 | 30 days supply | Qty: 90 | Fill #0

## 2021-02-11 MED FILL — GABAPENTIN 400 MG CAPSULE: 400 | 30 days supply | Qty: 90 | Fill #0

## 2021-02-11 MED FILL — METOPROLOL TARTRATE 25 MG T: 25 | 30 days supply | Qty: 30 | Fill #0

## 2021-02-11 MED FILL — ELIQUIS 5 MG TABLET: 5 | 30 days supply | Qty: 60 | Fill #0

## 2021-02-11 NOTE — Progress Notes (Signed)
Patient of [facility] Right leg amputee.Discuss prosthetic leg. Needs to discuss vascular referral. Power wheelchair No medication in 1 month.

## 2021-02-11 NOTE — Progress Notes (Signed)
Subjective:  Patient ID: Michael Billet., male    DOB: 1971-04-29  Age: 50 y.o. MRN: [id number]  CC: New Patient (Initial Visit)   HPI Michael Shinault. is a 50 year old male with a history of recurrent PE, recurrent DVT (currently on anticoagulation with Eliquis), schizophrenia, bipolar disorder, right AKA who presents today to establish care. He currently does not see Psychiatry. Previously seen at [facility] via Telehealth and currently does not see a psychiatrist.  He had a R AKA amputation in 12/2017 in [place] and had a prosthetic leg which is now too big as he lost weight.  He informs me amputation was secondary to issues with blood clot and he was advised by his previous PCP he needs to see a vascular surgeon and is requesting a referral.  He is also needing a new prosthetic leg and also a power wheelchair. Has been out of medications for 1 month BP is low normal at 102/65 but his medication list indicates he should be on metoprolol. Past Medical History:  Diagnosis Date  . Anxiety   . Bipolar disorder (HCC)   . Depression   . Hypertension   . PTSD (post-traumatic stress disorder)   . Pulmonary embolus (HCC)   . Schizophrenia [facility])     Past Surgical History:  Procedure Laterality Date  . CLAVICLE SURGERY     pt unsure whether it was left or right; has been broken "a couple times"  . LEG AMPUTATION      Family History  Family history unknown: Yes    No Known Allergies  Outpatient Medications Prior to Visit  Medication Sig Dispense Refill  . aspirin EC 81 MG tablet Take 1 tablet (81 mg total) by mouth daily. 30 tablet 0  . cyclobenzaprine (FLEXERIL) 10 MG tablet Take 1 tablet (10 mg total) by mouth 3 (three) times daily as needed for muscle spasms. 30 tablet 0  . OLANZapine (ZYPREXA) 10 MG tablet Take 1 tablet (10 mg total) by mouth at bedtime. 30 tablet 0  . apixaban (ELIQUIS) 5 MG TABS tablet Take 1 tablet (5 mg total) by mouth 2 (two) times daily. 60 tablet 0   . escitalopram (LEXAPRO) 20 MG tablet Take 1 tablet (20 mg total) by mouth daily. For depression 30 tablet 0  . gabapentin (NEURONTIN) 400 MG capsule Take 1 capsule (400 mg total) by mouth 3 (three) times daily. For agitation 45 capsule 0  . hydrOXYzine (ATARAX/VISTARIL) 25 MG tablet Take 1 tablet (25 mg total) by mouth every 8 (eight) hours as needed for anxiety. 45 tablet 0  . metoprolol tartrate (LOPRESSOR) 25 MG tablet Take 1 tablet (25 mg total) by mouth 2 (two) times daily. For high blood pressure 60 tablet 0   No facility-administered medications prior to visit.     ROS Review of Systems  Constitutional: Negative for activity change and appetite change.  HENT: Negative for sinus pressure and sore throat.   Eyes: Negative for visual disturbance.  Respiratory: Negative for cough, chest tightness and shortness of breath.   Cardiovascular: Negative for chest pain and leg swelling.  Gastrointestinal: Negative for abdominal distention, abdominal pain, constipation and diarrhea.  Endocrine: Negative.   Genitourinary: Negative for dysuria.  Musculoskeletal: Negative for joint swelling and myalgias.  Skin: Negative for rash.  Allergic/Immunologic: Negative.   Neurological: Negative for weakness, light-headedness and numbness.  Psychiatric/Behavioral: Negative for dysphoric mood and suicidal ideas.    Objective:  BP 102/65   Pulse 98  SpO2 97%   BP/Weight 02/11/2021 11/19/2020 11/18/2020  Systolic BP 102 113 -  Diastolic BP 65 82 -  Wt. (Lbs) - - 147  BMI - - 23.02      Physical Exam Constitutional:      Appearance: He is well-developed.  Neck:     Vascular: No JVD.  Cardiovascular:     Rate and Rhythm: Normal rate.     Heart sounds: Normal heart sounds. No murmur heard.   Pulmonary:     Effort: Pulmonary effort is normal.     Breath sounds: Normal breath sounds. No wheezing or rales.  Chest:     Chest wall: No tenderness.  Abdominal:     General: Bowel sounds are  normal. There is no distension.     Palpations: Abdomen is soft. There is no mass.     Tenderness: There is no abdominal tenderness.  Musculoskeletal:        General: Normal range of motion.     Right lower leg: No edema.     Left lower leg: No edema.  Neurological:     Mental Status: He is alert and oriented to person, place, and time.  Psychiatric:        Mood and Affect: Mood normal.     CMP Latest Ref Rng & Units 11/18/2020 09/02/2020 09/01/2020  Glucose 70 - 99 mg/dL 366(Y) - 95  BUN 6 - 20 mg/dL 12 - 11  Creatinine 4.03 - 1.24 mg/dL 4.74 - 2.59  Sodium 563 - 145 mmol/L 141 - 136  Potassium 3.5 - 5.1 mmol/L 3.2(L) - 3.2(L)  Chloride 98 - 111 mmol/L 102 - 104  CO2 22 - 32 mmol/L 26 - 22  Calcium 8.9 - 10.3 mg/dL 9.3 - 8.5(L)  Total Protein 6.5 - 8.1 g/dL 7.3 7.0 -  Total Bilirubin 0.3 - 1.2 mg/dL 0.7 0.4 -  Alkaline Phos 38 - 126 U/L 64 77 -  AST 15 - 41 U/L 23 39 -  ALT 0 - 44 U/L 23 43 -    Lipid Panel  No results found for: CHOL, TRIG, HDL, CHOLHDL, VLDL, LDLCALC, LDLDIRECT  CBC    Component Value Date/Time   WBC 11.9 (H) 11/18/2020 2017   RBC 4.78 11/18/2020 2017   HGB 14.2 11/18/2020 2017   HCT 41.2 11/18/2020 2017   PLT 390 11/18/2020 2017   MCV 86.2 11/18/2020 2017   MCH 29.7 11/18/2020 2017   MCHC 34.5 11/18/2020 2017   RDW 14.2 11/18/2020 2017   LYMPHSABS 3.7 11/18/2020 2017   MONOABS 0.5 11/18/2020 2017   EOSABS 0.1 11/18/2020 2017   BASOSABS 0.0 11/18/2020 2017    No results found for: HGBA1C  Assessment & Plan:  1. Hypokalemia Uncontrolled with last potassium of 3.2 - Basic Metabolic Panel  2. Schizoaffective disorder, bipolar type (HCC) He has been out of his medications for 1 week I have refilled his medications and he will need to obtain olanzapine from psych - Ambulatory referral to Psychiatry - escitalopram (LEXAPRO) 20 MG tablet; Take 1 tablet (20 mg total) by mouth daily. For depression  Dispense: 30 tablet; Refill: 6 - hydrOXYzine  (ATARAX/VISTARIL) 25 MG tablet; Take 1 tablet (25 mg total) by mouth every 8 (eight) hours as needed for anxiety.  Dispense: 45 tablet; Refill: 6  3. Recurrent pulmonary embolism (HCC) Currently on anticoagulation with Eliquis We will need to obtain previous records Due to request for referral to vascular surgery I have placed referral - Ambulatory referral  to Vascular Surgery - apixaban (ELIQUIS) 5 MG TABS tablet; Take 1 tablet (5 mg total) by mouth 2 (two) times daily.  Dispense: 60 tablet; Refill: 0  4. Hx of AKA (above knee amputation), right (HCC) He is currently using a manual wheelchair Prescription for prosthesis written Advised that we can discuss need for power wheelchair at a subsequent visit - Misc. Devices MISC; Right leg prosthesis.  Diagnosis right AKA  Dispense: 1 each; Refill: 0  5. Essential hypertension His blood pressure is low normal It does seem that his metoprolol might be for presumptive tachycardia rather than his blood pressure I have reduced the dose from 25 mg to 12.5 mg - metoprolol tartrate (LOPRESSOR) 25 MG tablet; Take 0.5 tablets (12.5 mg total) by mouth 2 (two) times daily. For high blood pressure  Dispense: 60 tablet; Refill: 6    Meds ordered this encounter  Medications  . apixaban (ELIQUIS) 5 MG TABS tablet    Sig: Take 1 tablet (5 mg total) by mouth 2 (two) times daily.    Dispense:  60 tablet    Refill:  0  . escitalopram (LEXAPRO) 20 MG tablet    Sig: Take 1 tablet (20 mg total) by mouth daily. For depression    Dispense:  30 tablet    Refill:  6  . gabapentin (NEURONTIN) 400 MG capsule    Sig: Take 1 capsule (400 mg total) by mouth 3 (three) times daily.    Dispense:  45 capsule    Refill:  6  . hydrOXYzine (ATARAX/VISTARIL) 25 MG tablet    Sig: Take 1 tablet (25 mg total) by mouth every 8 (eight) hours as needed for anxiety.    Dispense:  45 tablet    Refill:  6  . metoprolol tartrate (LOPRESSOR) 25 MG tablet    Sig: Take 0.5 tablets  (12.5 mg total) by mouth 2 (two) times daily. For high blood pressure    Dispense:  60 tablet    Refill:  6  . Misc. Devices MISC    Sig: Right leg prosthesis.  Diagnosis right AKA    Dispense:  1 each    Refill:  0    Follow-up: Return in about 1 month (around 03/14/2021) for Power wheelchair.       Hoy Register, MD, FAAFP. [facility] [place], [place] [phone]   02/11/2021, 1:19 PM

## 2021-02-11 NOTE — Patient Instructions (Signed)
Pulmonary Embolism  A pulmonary embolism (PE) is a sudden blockage or decrease of blood flow in one or both lungs that happens when a clot travels into the arteries of the lung (pulmonary arteries). Most blockages come from a blood clot that forms in the vein of a leg or arm (deep vein thrombosis, DVT) and travels to the lungs. A clot is blood that has thickened into a gel or solid. PE is a dangerous and life-threatening condition that needs to be treated right away. What are the causes? This condition is usually caused by a blood clot that forms in a vein and moves to the lungs. In rare cases, it may be caused by air, fat, part of a tumor, or other tissue that moves through the veins and into the lungs. What increases the risk? The following factors may make you more likely to develop this condition:  Experiencing a traumatic injury, such as breaking a hip or leg.  Having: ? A spinal cord injury. ? Major surgery, especially hip or knee replacement, or surgery on parts of the nervous system or on the abdomen. ? A stroke. ? A blood clotting disease. ? Long-term (chronic) lung or heart disease. ? Cancer, especially if you are being treated with chemotherapy. ? A central venous catheter.  Taking medicines that contain estrogen. These include birth control pills and hormone replacement therapy.  Being: ? Pregnant. ? In the period of time after your baby is delivered (postpartum). ? Older than age 60. ? Overweight. ? A smoker, especially if you have other risks. ? Not very active (sedentary), not being able to move at all, or spending long periods sitting, such as travel over 6 hours. You are also at a greater risk if you have a leg in a cast or splint. What are the signs or symptoms? Symptoms of this condition usually start suddenly and include:  Shortness of breath during activity or at rest.  Coughing, coughing up blood, or coughing up bloody mucus.  Chest pain, back pain, or  shoulder blade pain that gets worse with deep breaths.  Rapid or irregular heartbeat.  Feeling light-headed or dizzy, or fainting.  Feeling anxious.  Pain and swelling in a leg. This is a symptom of DVT, which can lead to PE. How is this diagnosed? This condition may be diagnosed based on your medical history, a physical exam, and tests. Tests may include:  Blood tests.  An ECG (electrocardiogram) of the heart.  A CT pulmonary angiogram. This test checks blood flow in and around your lungs.  A ventilation-perfusion scan, also called a lung VQ scan. This test measures air flow and blood flow to the lungs.  An ultrasound to check for a DVT. How is this treated? Treatment for this condition depends on many factors, such as the cause of your PE, your risk for bleeding or developing more clots, and other medical conditions you may have. Treatment aims to stop blood clots from forming or growing larger. In some cases, treatment may be aimed at breaking apart or removing the blood clot. Treatment may include:  Medicines, such as: ? Blood thinning medicines, also called anticoagulants, to stop clots from forming and growing. ? Medicines that break apart clots (thrombolytics).  Procedures, such as: ? Using a flexible tube to remove a blood clot (embolectomy) or to deliver medicine to destroy it (catheter-directed thrombolysis). ? Surgery to remove the clot (surgical embolectomy). This is rare. You may need a combination of immediate, long-term, and extended   treatments. Your treatment may continue for several months (maintenance therapy) or longer depending on your medical conditions. You and your health care provider will work together to choose the treatment program that is best for you. Follow these instructions at home: Medicines  Take over-the-counter and prescription medicines only as told by your health care provider.  If you are taking blood thinners: ? Talk with your health care  provider before you take any medicines that contain aspirin or NSAIDs, such as ibuprofen. These medicines increase your risk for dangerous bleeding. ? Take your medicine exactly as told, at the same time every day. ? Avoid activities that could cause injury or bruising, and follow instructions about how to prevent falls. ? Wear a medical alert bracelet or carry a card that lists what medicines you take.  Understand what foods and drugs interact with any medicines that you are taking. General instructions  Ask your health care provider when you may return to your normal activities. Avoid sitting or lying for a long time without moving.  Maintain a healthy weight. Ask your health care provider what weight is healthy for you.  Do not use any products that contain nicotine or tobacco, such as cigarettes, e-cigarettes, and chewing tobacco. If you need help quitting, ask your health care provider.  Talk with your health care provider about any travel plans. It is important to make sure that you are still able to take your medicine while traveling.  Keep all follow-up visits as told by your health care provider. This is important. Where to find more information  American Lung Association: www.lung.org  Centers for Disease Control and Prevention: www.cdc.gov Contact a health care provider if:  You missed a dose of your blood thinner medicine. Get help right away if you:  Have: ? New or increased pain, swelling, warmth, or redness in an arm or leg. ? Shortness of breath that gets worse during activity or at rest. ? A fever. ? Worsening chest pain. ? A rapid or irregular heartbeat. ? A severe headache. ? Vision changes. ? A serious fall or accident, or you hit your head. ? Stomach pain. ? Blood in your vomit, stool, or urine. ? A cut that will not stop bleeding.  Cough up blood.  Feel light-headed or dizzy, and that feeling does not go away.  Cannot move your arms or legs.  Are  confused or have memory loss. These symptoms may represent a serious problem that is an emergency. Do not wait to see if the symptoms will go away. Get medical help right away. Call your local emergency services (911 in the [place] Do not drive yourself to the hospital. Summary  A pulmonary embolism (PE) is a serious and potentially life-threatening condition, in which a blood clot from one part of the body (deep vein thrombosis, DVT) travels to the arteries of the lung, causing a sudden blockage or decrease of blood flow to the lungs. This may result in shortness of breath, chest pain, dizziness, and fainting.  Treatments for this condition usually include medicines to thin your blood (anticoagulants) or medicines to break apart blood clots (thrombolytics).  If you are given blood thinners, take your medicine exactly as told by your health care provider, at the same time every day. This is important.  Understand what foods and drugs interact with any medicines that you are taking.  If you have signs of PE or DVT, call your local emergency services (911 in the [place] This information is not   intended to replace advice given to you by your health care provider. Make sure you discuss any questions you have with your health care provider. Document Revised: 10/13/2019 Document Reviewed: 10/13/2019 Elsevier Patient Education  2021 [employer].  

## 2021-02-12 LAB — BASIC METABOLIC PANEL
BUN/Creatinine Ratio: 12 (ref 9–20)
BUN: 8 mg/dL (ref 6–24)
CO2: 25 mmol/L (ref 20–29)
Calcium: 9.5 mg/dL (ref 8.7–10.2)
Chloride: 101 mmol/L (ref 96–106)
Creatinine, Ser: 0.67 mg/dL — ABNORMAL LOW (ref 0.76–1.27)
Glucose: 73 mg/dL (ref 65–99)
Potassium: 4 mmol/L (ref 3.5–5.2)
Sodium: 141 mmol/L (ref 134–144)
eGFR: 115 mL/min/{1.73_m2} (ref >59)

## 2021-02-13 ENCOUNTER — Telehealth: Payer: Self-pay

## 2021-02-13 NOTE — Telephone Encounter (Signed)
Patient name and DOB has been verified Patient was informed of lab results. Patient had no questions.  

## 2021-02-13 NOTE — Telephone Encounter (Signed)
-----   Message from Hoy Register, MD sent at 02/12/2021  8:48 AM EST ----- Please inform the patient that labs are normal. Thank you.

## 2021-02-14 NOTE — Telephone Encounter (Signed)
Order has been faxed to [facility] and pt will be notified of next steps.

## 2021-02-14 NOTE — Telephone Encounter (Signed)
Patient would like the nurse to call him regarding his prosthetic leg.  He is not sure if he needs to contact someone or if the doctor is going to schedule an appt. With a specialist.  Please call to advise at [phone]

## 2021-02-18 ENCOUNTER — Ambulatory Visit (INDEPENDENT_AMBULATORY_CARE_PROVIDER_SITE_OTHER): Payer: Medicaid Other | Admitting: Physician Assistant

## 2021-02-18 ENCOUNTER — Other Ambulatory Visit: Payer: Self-pay | Admitting: Physician Assistant

## 2021-02-18 ENCOUNTER — Other Ambulatory Visit: Payer: Self-pay

## 2021-02-18 ENCOUNTER — Encounter (HOSPITAL_COMMUNITY): Payer: Self-pay | Admitting: Physician Assistant

## 2021-02-18 VITALS — BP 124/87 | HR 97 | Temp 98.3°F | Ht 66.0 in | Wt 142.0 lb

## 2021-02-18 DIAGNOSIS — F411 Generalized anxiety disorder: Secondary | ICD-10-CM | POA: Insufficient documentation

## 2021-02-18 DIAGNOSIS — F250 Schizoaffective disorder, bipolar type: Secondary | ICD-10-CM | POA: Diagnosis not present

## 2021-02-18 DIAGNOSIS — F15951 Other stimulant use, unspecified with stimulant-induced psychotic disorder with hallucinations: Secondary | ICD-10-CM | POA: Diagnosis not present

## 2021-02-18 DIAGNOSIS — F4310 Post-traumatic stress disorder, unspecified: Secondary | ICD-10-CM

## 2021-02-18 MED ORDER — OLANZAPINE 10 MG PO TABS: 10.0000 mg | ORAL_TABLET | Freq: Every day | ORAL | 2 refills | Status: DC

## 2021-02-18 MED ORDER — ESCITALOPRAM OXALATE 20 MG PO TABS: 20.0000 mg | ORAL_TABLET | Freq: Every day | ORAL | 2 refills | Status: DC

## 2021-02-18 MED ORDER — HYDROXYZINE HCL 25 MG PO TABS: 25.0000 mg | ORAL_TABLET | Freq: Three times a day (TID) | ORAL | 1 refills | Status: DC | PRN

## 2021-02-18 NOTE — Progress Notes (Signed)
Psychiatric Initial Adult Assessment   Patient Identification: Michael Farrell. MRN:  [id number] Date of Evaluation:  02/18/2021 Referral Source: Vesta Mixer Chief Complaint:   Chief Complaint    Medication Management     Visit Diagnosis: No diagnosis found.  History of Present Illness:    Michael Farrell. Is a 50 year old male with a past psychiatric history significant for schizoaffective (bipolar type), amphetamine induced psychotic disorder, and PTSD who presents to [facility] for medication management.  Before being set up with [facility], patient was being seen at [facility] While at [facility], on the following medications:  Olanzapine 10 mg at bedtime Hydroxyzine 25 mg every 8 hours as needed Escitalopram 20 mg daily  Patient states that he is currently out of all his medications.  Patient is unsure if his current regimen of medications is helpful in the management of his psychiatric conditions.  Patient endorses the following symptoms: auditory hallucinations and sleep disturbances.  Patient describes his auditory hallucinations as threats made towards him. Patient states that sometimes his auditory hallucinations are just comprised of talking.  Patient endorses anxiety he rates an 8 out of 10.  Patient also expresses that he experiences panic attacks every couple of days.  Patient's panic attacks are characterized by sweating and difficulty breathing.  Patient states when he is having a really bad panic attack, he may get under his bed.  Patient reports that his significant other once caught him out in the woods pretending to hunt as a result of a panic attack he experienced.  Patient expresses that he normally keeps to himself and does not consider himself a very social individual.  Patient is calm, cooperative, and fully engaged in conversation during the encounter.  Patient denies active suicidal or homicidal ideations.  A [place] Suicide  Severity Rating Scale was performed with the patient considered high-risk. Patient states that he does not feel like a danger to himself and is able to contract for his safety.  Patient endorses active auditory hallucinations but denies any visual hallucinations.  Patient endorses poor sleep and receives roughly an hour of sleep each night.  Patient endorses poor appetite as well and states that he has on average 1 meal per day.  Although patient does not report any recent weight changes, patient states that since February of last year, he has lost a total of 40 to 50 pounds total.  Patient endorses alcohol consumption and states that he drank a beer this past Sunday.  Before having his beer on Sunday, patient stated he has not had a drink since December.  Patient endorses tobacco use and states that he has less than a pack a day.  Patient denies current illicit drug use but states that he had used methamphetamine in the past.  Patient states that he would use methamphetamine as a means to help kill the pain in his leg.  Patient reports that he last used methamphetamine couple months ago.  Associated Signs/Symptoms: Depression Symptoms:  depressed mood, anhedonia, insomnia, psychomotor agitation, psychomotor retardation, fatigue, feelings of worthlessness/guilt, difficulty concentrating, hopelessness, impaired memory, recurrent thoughts of death, suicidal attempt, anxiety, panic attacks, loss of energy/fatigue, disturbed sleep, decreased labido, decreased appetite, (Hypo) Manic Symptoms:  Delusions, Distractibility, Elevated Mood, Flight of Ideas, Grandiosity, Hallucinations, Impulsivity, Irritable Mood, Labiality of Mood, Sexually Inapproprite Behavior, Anxiety Symptoms:  Agoraphobia, Excessive Worry, Panic Symptoms, Obsessive Compulsive Symptoms:   Checking, Patient reports that he cleans excessively and has a habit in sitting in  corners, Social Anxiety, Psychotic Symptoms:   Hallucinations: Auditory Visual Paranoia, PTSD Symptoms: Had a traumatic exposure:  Patient reports that he has probably experienced some trauma but is unable to pinpoint precisely the nature of the trauma Had a traumatic exposure in the last month:  N/A Re-experiencing:  Flashbacks Intrusive Thoughts Nightmares Hypervigilance:  Yes Hyperarousal:  Difficulty Concentrating Emotional Numbness/Detachment Increased Startle Response Irritability/Anger Sleep Avoidance:  Decreased Interest/Participation Foreshortened Future  Past Psychiatric History:  Per patient: PTSD Bipolar disorder Paranoid schiophrenia   Previous Psychotropic Medications: Yes   Substance Abuse History in the last 12 months:  Yes.    Consequences of Substance Abuse: Medical Consequences:  None Legal Consequences:  None Family Consequences:  None Blackouts:  None DT's: N/A Withdrawal Symptoms:   None  Past Medical History:  Past Medical History:  Diagnosis Date  . Anxiety   . Bipolar disorder (HCC)   . Depression   . Hypertension   . PTSD (post-traumatic stress disorder)   . Pulmonary embolus (HCC)   . Schizophrenia [facility](HCC)     Past Surgical History:  Procedure Laterality Date  . CLAVICLE SURGERY     pt unsure whether it was left or right; has been broken "a couple times"  . LEG AMPUTATION      Family Psychiatric History:  Patient states that he has not been around his family enough to determine if they have any psychiatric conditions.  Family History:  Family History  Family history unknown: Yes    Social History:   Social History   Socioeconomic History  . Marital status: Married    Spouse name: Not on file  . Number of children: 0  . Years of education: Not on file  . Highest education level: GED or equivalent  Occupational History  . Occupation: unemployed  Tobacco Use  . Smoking status: Current Every Day Smoker    Packs/day: 1.50    Years: 42.00    Pack years: 63.00     Types: Cigarettes  . Smokeless tobacco: Former NeurosurgeonUser  . Tobacco comment: It has been hard to stop smoking  Vaping Use  . Vaping Use: Never used  Substance and Sexual Activity  . Alcohol use: Not Currently    Comment: 2 drinks in the past 6 months  . Drug use: Yes    Types: Methamphetamines    Comment: past thursday  . Sexual activity: Not Currently  Other Topics Concern  . Not on file  Social History Narrative  . Not on file   Social Determinants of Health   Financial Resource Strain: Not on file  Food Insecurity: Not on file  Transportation Needs: Not on file  Physical Activity: Not on file  Stress: Not on file  Social Connections: Not on file    Additional Social History:  Patient states that he is currently volunteering on his spare time  Allergies:  No Known Allergies  Metabolic Disorder Labs: No results found for: HGBA1C, MPG No results found for: PROLACTIN No results found for: CHOL, TRIG, HDL, CHOLHDL, VLDL, LDLCALC No results found for: TSH  Therapeutic Level Labs: No results found for: LITHIUM No results found for: CBMZ No results found for: VALPROATE  Current Medications: Current Outpatient Medications  Medication Sig Dispense Refill  . apixaban (ELIQUIS) 5 MG TABS tablet Take 1 tablet (5 mg total) by mouth 2 (two) times daily. 60 tablet 0  . aspirin EC 81 MG tablet Take 1 tablet (81 mg total) by mouth daily. 30 tablet 0  .  cyclobenzaprine (FLEXERIL) 10 MG tablet Take 1 tablet (10 mg total) by mouth 3 (three) times daily as needed for muscle spasms. 30 tablet 0  . escitalopram (LEXAPRO) 20 MG tablet Take 1 tablet (20 mg total) by mouth daily. For depression 30 tablet 6  . gabapentin (NEURONTIN) 400 MG capsule Take 1 capsule (400 mg total) by mouth 3 (three) times daily. 45 capsule 6  . hydrOXYzine (ATARAX/VISTARIL) 25 MG tablet Take 1 tablet (25 mg total) by mouth every 8 (eight) hours as needed for anxiety. 45 tablet 6  . metoprolol tartrate (LOPRESSOR)  25 MG tablet Take 0.5 tablets (12.5 mg total) by mouth 2 (two) times daily. For high blood pressure 60 tablet 6  . Misc. Devices MISC Right leg prosthesis.  Diagnosis right AKA 1 each 0  . OLANZapine (ZYPREXA) 10 MG tablet Take 1 tablet (10 mg total) by mouth at bedtime. 30 tablet 0   No current facility-administered medications for this visit.    Musculoskeletal: Strength & Muscle Tone: within normal limits Gait & Station: Patient's right leg is amputated. Patient ambulates through use of a wheel chair Patient leans: N/A  Psychiatric Specialty Exam: Review of Systems  Psychiatric/Behavioral: Positive for dysphoric mood, hallucinations and sleep disturbance. Negative for agitation, decreased concentration, self-injury and suicidal ideas. The patient is nervous/anxious. The patient is not hyperactive.     Blood pressure 124/87, pulse 97, temperature 98.3 F (36.8 C), height 5\' 6"  (1.676 m), weight 142 lb (64.4 kg), SpO2 99 %.Body mass index is 22.92 kg/m.  General Appearance: Fairly Groomed  Eye Contact:  Good  Speech:  Clear and Coherent and Normal Rate  Volume:  Normal  Mood:  Anxious, Depressed and Dysphoric  Affect:  Congruent and Depressed  Thought Process:  Coherent, Goal Directed and Descriptions of Associations: Intact  Orientation:  Full (Time, Place, and Person)  Thought Content:  WDL and Hallucinations: Auditory  Suicidal Thoughts:  No  Homicidal Thoughts:  No  Memory:  Immediate;   Good Recent;   Fair Remote;   Fair  Judgement:  Fair  Insight:  Fair  Psychomotor Activity:  Restlessness  Concentration:  Concentration: Good and Attention Span: Good  Recall:  Fair  Fund of Knowledge:Fair  Language: Good  Akathisia:  NA  Handed:  Right  AIMS (if indicated):  not done  Assets:  Communication Skills Desire for Improvement Housing  ADL's:  Intact  Cognition: WNL  Sleep:  Poor   Screenings: AIMS   Flowsheet Row Admission (Discharged) from OP Visit from 05/26/2020  [facility]) [facility]  AIMS Total Score 0 0    AUDIT   Flowsheet Row Admission (Discharged) from OP Visit from 05/26/2020 [facility] Admission (Discharged) [facility]  Alcohol Use Disorder Identification Test Final Score (AUDIT) 5 1    GAD-7   Flowsheet Row Office Visit from 02/18/2021 in [facility]  Total GAD-7 Score 21    PHQ2-9   Flowsheet Row Office Visit from 02/18/2021 in [facility] Office Visit from 02/11/2021 in [facility] ED from 11/18/2020 in [facility]  PHQ-2 Total Score 5 4 6   PHQ-9 Total Score 22 20 27     Flowsheet Row Office Visit from 02/18/2021 in [facility] ED from 11/18/2020 in [facility] ED  from 05/27/2020 in [facility]  C-SSRS RISK CATEGORY High Risk High Risk High Risk      Assessment and Plan:   Michael Farrell. Is a 50 year old male with a past psychiatric history significant for schizoaffective (bipolar type), amphetamine induced psychotic disorder, and PTSD who presents to [facility] for medication management.  Patient reports that he is currently out of his medications and is experiencing the following symptoms: auditory hallucinations, insomnia, anxiety, panic attacks, and decreased appetite.  A [place] Suicide Severity Risk scale was performed today with the patient considered high-risk.  Patient denies being a danger to himself and is able to contract for safety following the conclusion of the encounter.  Patient is currently taking olanzapine 10 mg at bedtime, hydroxyzine 25 mg every 8 hours as needed, and escitalopram 20 mg daily.  Patient  to be placed back on his medications following the conclusion of the encounter.  Patient's medications will be e-prescribed to pharmacy of choice.  Patient to be set up with a licensed clinical social worker following the conclusion of the encounter.  1. 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  Dispense: 30 tablet; Refill: 2  2. Generalized anxiety disorder  - escitalopram (LEXAPRO) 20 MG tablet; Take 1 tablet (20 mg total) by mouth daily. For depression  Dispense: 30 tablet; Refill: 2 - hydrOXYzine (ATARAX/VISTARIL) 25 MG tablet; Take 1 tablet (25 mg total) by mouth 3 (three) times daily as needed for anxiety.  Dispense: 75 tablet; Refill: 1  3. Schizoaffective disorder, bipolar type (HCC)  - escitalopram (LEXAPRO) 20 MG tablet; Take 1 tablet (20 mg total) by mouth daily. For depression  Dispense: 30 tablet; Refill: 2 - OLANZapine (ZYPREXA) 10 MG tablet; Take 1 tablet (10 mg total) by mouth at bedtime.  Dispense: 30 tablet; Refill: 2  4. PTSD (post-traumatic stress disorder)  - escitalopram (LEXAPRO) 20 MG tablet; Take 1 tablet (20 mg total) by mouth daily. For depression  Dispense: 30 tablet; Refill: 2  Patient to follow up in 6 weeks  Meta Hatchet, PA 3/8/202211:30 AM

## 2021-02-19 MED FILL — OLANZapine 10 MG TABS: 10 | 30 days supply | Qty: 30 | Fill #0

## 2021-02-21 ENCOUNTER — Telehealth: Payer: Self-pay | Admitting: Family Medicine

## 2021-02-21 NOTE — Telephone Encounter (Signed)
Copied from CRM [id number]. Topic: General - Other >> Feb 21, 2021  9:42 AM Elliot Gault wrote: Jethro Bolus name: Kara Mead  Relation to pt: Case manager  Call back number: [phone]    Reason for call:  Checking on the prosthetic leg order, patient was last seen by PCP to discuss 02/11/2021

## 2021-02-21 NOTE — Telephone Encounter (Signed)
Michael Farrell was called and a VM was left informing her to return phone call.  Orders has been sent to [facility].

## 2021-03-05 ENCOUNTER — Other Ambulatory Visit: Payer: Self-pay

## 2021-03-05 ENCOUNTER — Emergency Department (HOSPITAL_COMMUNITY)
Admission: EM | Admit: 2021-03-05 | Discharge: 2021-03-05 | Disposition: A | Discharge status: Home / Self Care | Payer: Medicaid Other | Attending: Emergency Medicine | Admitting: Emergency Medicine

## 2021-03-05 ENCOUNTER — Emergency Department (HOSPITAL_COMMUNITY)
Admission: EM | Admit: 2021-03-05 | Discharge: 2021-03-05 | Discharge status: Absent without leave | Payer: Medicaid Other

## 2021-03-05 ENCOUNTER — Encounter (HOSPITAL_COMMUNITY): Payer: Self-pay

## 2021-03-05 ENCOUNTER — Emergency Department (HOSPITAL_COMMUNITY): Payer: Medicaid Other

## 2021-03-05 DIAGNOSIS — S7001XA Contusion of right hip, initial encounter: Secondary | ICD-10-CM

## 2021-03-05 DIAGNOSIS — I10 Essential (primary) hypertension: Secondary | ICD-10-CM | POA: Diagnosis not present

## 2021-03-05 DIAGNOSIS — S79911A Unspecified injury of right hip, initial encounter: Secondary | ICD-10-CM | POA: Diagnosis present

## 2021-03-05 DIAGNOSIS — Z86711 Personal history of pulmonary embolism: Secondary | ICD-10-CM | POA: Insufficient documentation

## 2021-03-05 DIAGNOSIS — F17210 Nicotine dependence, cigarettes, uncomplicated: Secondary | ICD-10-CM | POA: Insufficient documentation

## 2021-03-05 DIAGNOSIS — Z7982 Long term (current) use of aspirin: Secondary | ICD-10-CM | POA: Insufficient documentation

## 2021-03-05 DIAGNOSIS — Z7901 Long term (current) use of anticoagulants: Secondary | ICD-10-CM | POA: Insufficient documentation

## 2021-03-05 DIAGNOSIS — Z79899 Other long term (current) drug therapy: Secondary | ICD-10-CM | POA: Diagnosis not present

## 2021-03-05 MED ORDER — KETOROLAC TROMETHAMINE 60 MG/2ML IM SOLN
60.0000 mg | Freq: Once | INTRAMUSCULAR | Status: AC
  Administered 2021-03-05: 60 mg via INTRAMUSCULAR
  Filled 2021-03-05: qty 2

## 2021-03-05 MED ORDER — OXYCODONE-ACETAMINOPHEN 5-325 MG PO TABS
1.0000 | ORAL_TABLET | Freq: Four times a day (QID) | ORAL | 0 refills | Status: AC | PRN
Start: 2021-03-05 — End: ?

## 2021-03-05 NOTE — ED Triage Notes (Signed)
Pt arrived via POV, was using motorized wheelchair last night, tipped and hurt back. C/o worsening rt lower back pain since.

## 2021-03-05 NOTE — Discharge Instructions (Addendum)
Please refer to the attached instructions. As you are on blood thinners, long term use of NSAID medications (ibuprofen, naproxen, aleve, and others) are not recommended due to the risk of increased bleeding. Please use tylenol (500 to 1000 mg) for pain. You have a prescription for a few percocet if needed for pain not controlled with the gabapentin and tylenol.

## 2021-03-05 NOTE — ED Provider Notes (Signed)
[facility]EM[facility]PT Provider Note   CSN: [id number] Arrival date & time: 03/05/21  0913     History Chief Complaint  Patient presents with  . Back Pain    Michael Farrell. is a 50 y.o. male.  Patient reports he was ascending a ramp in his wheelchair when it tipped backwards. Patient fell out of wheelchair and landed on the right side of his back/pelvis. He did not hit his head or lose consciousness. Patient with history of right AKA, bipolar disorder.  The history is provided by the patient and medical records. No language interpreter was used.  Back Pain Location:  Gluteal region Quality:  Shooting Pain severity:  Moderate Pain is:  Same all the time Onset quality:  Sudden Duration:  1 day Timing:  Constant Progression:  Unchanged Chronicity:  New Context: falling   Ineffective treatments:  None tried Associated symptoms: no abdominal pain, no bladder incontinence, no bowel incontinence and no perianal numbness   Risk factors: vascular disease        Past Medical History:  Diagnosis Date  . Anxiety   . Bipolar disorder (HCC)   . Depression   . Hypertension   . PTSD (post-traumatic stress disorder)   . Pulmonary embolus (HCC)   . Schizophrenia [facility])     Patient Active Problem List   Diagnosis Date Noted  . Generalized anxiety disorder 02/18/2021  . Schizoaffective disorder, bipolar type (HCC) 02/18/2021  . PTSD (post-traumatic stress disorder) 02/18/2021  . Adjustment disorder with mixed anxiety and depressed mood 11/18/2020  . Substance-induced psychotic disorder (HCC)   . Methamphetamine dependence (HCC)   . Depression 05/17/2020  . MDD (major depressive disorder) 05/17/2020  . MDD (major depressive disorder), recurrent episode, severe (HCC) 05/16/2020  . 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 Moderate benzodiazepine use disorder (HCC) 05/01/2020    Past Surgical  History:  Procedure Laterality Date  . CLAVICLE SURGERY     pt unsure whether it was left or right; has been broken "a couple times"  . LEG AMPUTATION         Family History  Family history unknown: Yes    Social History   Tobacco Use  . Smoking status: Current Every Day Smoker    Packs/day: 1.50    Years: 42.00    Pack years: 63.00    Types: Cigarettes  . Smokeless tobacco: Former Neurosurgeon  . Tobacco comment: It has been hard to stop smoking  Vaping Use  . Vaping Use: Never used  Substance Use Topics  . Alcohol use: Not Currently    Comment: 2 drinks in the past 6 months  . Drug use: Yes    Types: Methamphetamines    Comment: past thursday    Home Medications Prior to Admission medications   Medication Sig Start Date End Date Taking? Authorizing Provider  apixaban (ELIQUIS) 5 MG TABS tablet Take 1 tablet (5 mg total) by mouth 2 (two) times daily. 02/11/21   Hoy Register, MD  aspirin EC 81 MG tablet Take 1 tablet (81 mg total) by mouth daily. 09/02/20   Caccavale, Sophia, PA-C  cyclobenzaprine (FLEXERIL) 10 MG tablet Take 1 tablet (10 mg total) by mouth 3 (three) times daily as needed for muscle spasms. 05/19/20   Armandina Stammer I, NP  escitalopram (LEXAPRO) 20 MG tablet Take 1 tablet (20 mg total) by mouth daily. For depression 02/18/21   Nwoko, Tommas Olp, PA  gabapentin (NEURONTIN)  400 MG capsule Take 1 capsule (400 mg total) by mouth 3 (three) times daily. 02/11/21   Hoy Register, MD  hydrOXYzine (ATARAX/VISTARIL) 25 MG tablet Take 1 tablet (25 mg total) by mouth 3 (three) times daily as needed for anxiety. 02/18/21   Nwoko, Tommas Olp, PA  metoprolol tartrate (LOPRESSOR) 25 MG tablet Take 0.5 tablets (12.5 mg total) by mouth 2 (two) times daily. For high blood pressure 02/11/21   Hoy Register, MD  Misc. Devices MISC Right leg prosthesis.  Diagnosis right AKA 02/11/21   Hoy Register, MD  OLANZapine (ZYPREXA) 10 MG tablet Take 1 tablet (10 mg total) by mouth at bedtime. 02/18/21    Meta Hatchet, PA    Allergies    Patient has no known allergies.  Review of Systems   Review of Systems  Gastrointestinal: Negative for abdominal pain and bowel incontinence.  Genitourinary: Negative for bladder incontinence.  Musculoskeletal: Positive for back pain.  All other systems reviewed and are negative.   Physical Exam Updated Vital Signs BP 112/70 (BP Location: Left Arm)   Pulse 96   Temp 98.3 F (36.8 C) (Oral)   Resp 16   SpO2 99%   Physical Exam Vitals and nursing note reviewed.  Constitutional:      General: He is not in acute distress. HENT:     Head: Atraumatic.     Nose: Nose normal.     Mouth/Throat:     Mouth: Mucous membranes are moist.  Eyes:     Conjunctiva/sclera: Conjunctivae normal.  Cardiovascular:     Rate and Rhythm: Normal rate and regular rhythm.  Pulmonary:     Effort: Pulmonary effort is normal.     Breath sounds: Normal breath sounds.  Abdominal:     Palpations: Abdomen is soft.  Musculoskeletal:        General: Tenderness present.     Cervical back: Normal range of motion.       Back:     Comments: Right AKA.  Skin:    General: Skin is warm and dry.  Neurological:     General: No focal deficit present.     Mental Status: He is alert and oriented to person, place, and time.  Psychiatric:        Mood and Affect: Mood normal.        Behavior: Behavior normal.     ED Results / Procedures / Treatments   Labs (all labs ordered are listed, but only abnormal results are displayed) Labs Reviewed - No data to display  EKG None  Radiology DG Pelvis 1-2 Views  Result Date: 03/05/2021 CLINICAL DATA:  Fall from wheelchair with pelvic pain on the right EXAM: PELVIS - 1-2 VIEW COMPARISON:  None. FINDINGS: There is no evidence of pelvic fracture or diastasis. No pelvic bone lesions are seen. Lucent right hemipelvis compared to the left which could be technical or from disuse osteopenia. IMPRESSION: No acute finding.  Electronically Signed   By: Marnee Spring M.D.   On: 03/05/2021 10:08    Procedures Procedures   Medications Ordered in ED Medications  ketorolac (TORADOL) injection 60 mg (60 mg Intramuscular Given 03/05/21 1042)    ED Course  I have reviewed the triage vital signs and the nursing notes.  Pertinent labs & imaging results that were available during my care of the patient were reviewed by me and considered in my medical decision making (see chart for details).    MDM Rules/Calculators/A&P  Patient with right hip/pelvis pain after falling from wheelchair yesterday. Patient X-Ray negative for obvious fracture or dislocation. Pain improved after toradol injection. Patient will be discharged home & is recommended to follow-up with PCP. Returns precautions discussed. Pt appears safe for discharge. Final Clinical Impression(s) / ED Diagnoses Final diagnoses:  Fall from motorized wheelchair, initial encounter  Contusion of right hip, initial encounter    Rx / DC Orders ED Discharge Orders         Ordered    oxyCODONE-acetaminophen (PERCOCET) 5-325 MG tablet  Every 6 hours PRN        03/05/21 1146           Felicie Morn, NP 03/05/21 1151    Terrilee Files, MD 03/05/21 1757

## 2021-03-06 ENCOUNTER — Telehealth: Payer: Self-pay | Admitting: *Deleted

## 2021-03-06 NOTE — Telephone Encounter (Signed)
Transition Care Management Follow-up Telephone Call  Date of discharge and from where: 03/05/2021 - [facility] ED  How have you been since you were released from the hospital? "A little sore from the fall"  Any questions or concerns? No  Items Reviewed:  Did the pt receive and understand the discharge instructions provided? Yes   Medications obtained and verified? Yes   Other? No   Any new allergies since your discharge? No   Dietary orders reviewed? No  Do you have support at home? Homeless at the time   Functional Questionnaire: (I = Independent and D = Dependent) ADLs: I  Bathing/Dressing- I  Meal Prep- I  Eating- I  Maintaining continence- I  Transferring/Ambulation- D - motorized wheelchair  Managing Meds- I  Follow up appointments reviewed:   PCP Hospital f/u appt confirmed? Yes  Scheduled to see Dr. Alvis Lemmings on 03/18/2021 @ 0910.  Specialist Hospital f/u appt confirmed? No    Are transportation arrangements needed? No   If their condition worsens, is the pt aware to call PCP or go to the Emergency Dept.? Yes  Was the patient provided with contact information for the PCP's office or ED? Yes  Was to pt encouraged to call back with questions or concerns? Yes

## 2021-03-14 ENCOUNTER — Other Ambulatory Visit: Payer: Self-pay

## 2021-03-14 ENCOUNTER — Encounter: Payer: Self-pay | Admitting: Vascular Surgery

## 2021-03-14 ENCOUNTER — Ambulatory Visit (INDEPENDENT_AMBULATORY_CARE_PROVIDER_SITE_OTHER): Payer: Medicaid Other | Admitting: Vascular Surgery

## 2021-03-14 VITALS — BP 100/68 | HR 95 | Temp 98.3°F | Resp 20 | Ht 66.0 in | Wt 142.0 lb

## 2021-03-14 DIAGNOSIS — Z89611 Acquired absence of right leg above knee: Secondary | ICD-10-CM | POA: Diagnosis not present

## 2021-03-14 NOTE — Progress Notes (Signed)
Patient ID: Michael Farrell., male   DOB: January 30, 1971, 50 y.o.   MRN: [id number]  Reason for Consult: New Patient (Initial Visit)   Referred by Hoy Register, MD  Subjective:     HPI:  Michael Farrell. is a 50 y.o. male history of multiple pulmonary embolus also states that he has had DVT in the past but also in taking history sounds as though he has had acute limb ischemia of the right lower extremity underwent bypass in [place] ultimately underwent right above-knee amputation.  Patient with significant psych disorder.  Currently on Eliquis.  No studies today.  Denies history of stroke, TIA or amaurosis.  No personal or family history of aneurysm disease.  He has been a smoker since 50 years old.  Past Medical History:  Diagnosis Date  . Anxiety   . Bipolar disorder (HCC)   . COPD (chronic obstructive pulmonary disease) (HCC)   . Depression   . DVT (deep venous thrombosis) (HCC)   . Hypertension   . PTSD (post-traumatic stress disorder)   . Pulmonary embolus (HCC)   . Schizophrenia (HCC)    Family History  Family history unknown: Yes   Past Surgical History:  Procedure Laterality Date  . CLAVICLE SURGERY     pt unsure whether it was left or right; has been broken "a couple times"  . LEG AMPUTATION      Short Social History:  Social History   Tobacco Use  . Smoking status: Current Every Day Smoker    Packs/day: 0.50    Years: 42.00    Pack years: 21.00    Types: Cigarettes  . Smokeless tobacco: Former Neurosurgeon  . Tobacco comment: It has been hard to stop smoking  Substance Use Topics  . Alcohol use: Not Currently    Comment: 2 drinks in the past 6 months    No Known Allergies  Current Outpatient Medications  Medication Sig Dispense Refill  . apixaban (ELIQUIS) 5 MG TABS tablet Take 1 tablet (5 mg total) by mouth 2 (two) times daily. 60 tablet 0  . aspirin EC 81 MG tablet Take 1 tablet (81 mg total) by mouth daily. 30 tablet 0  . cyclobenzaprine (FLEXERIL)  10 MG tablet Take 1 tablet (10 mg total) by mouth 3 (three) times daily as needed for muscle spasms. 30 tablet 0  . escitalopram (LEXAPRO) 20 MG tablet Take 1 tablet (20 mg total) by mouth daily. For depression 30 tablet 2  . gabapentin (NEURONTIN) 400 MG capsule Take 1 capsule (400 mg total) by mouth 3 (three) times daily. 45 capsule 6  . hydrOXYzine (ATARAX/VISTARIL) 25 MG tablet Take 1 tablet (25 mg total) by mouth 3 (three) times daily as needed for anxiety. 75 tablet 1  . metoprolol tartrate (LOPRESSOR) 25 MG tablet Take 0.5 tablets (12.5 mg total) by mouth 2 (two) times daily. For high blood pressure 60 tablet 6  . Misc. Devices MISC Right leg prosthesis.  Diagnosis right AKA 1 each 0  . OLANZapine (ZYPREXA) 10 MG tablet Take 1 tablet (10 mg total) by mouth at bedtime. 30 tablet 2  . oxyCODONE-acetaminophen (PERCOCET) 5-325 MG tablet Take 1 tablet by mouth every 6 (six) hours as needed for severe pain. 10 tablet 0   No current facility-administered medications for this visit.    Review of Systems  Constitutional:  Constitutional negative. HENT: HENT negative.  Eyes: Eyes negative.  Cardiovascular: Cardiovascular negative.  GI: Gastrointestinal negative.  Musculoskeletal: Musculoskeletal negative.  Skin: Skin negative.  Neurological: Neurological negative. Psychiatric: Positive for behavioral problem and depressed mood.        Objective:  Objective   Vitals:   03/14/21 0951  BP: 100/68  Pulse: 95  Resp: 20  Temp: 98.3 F (36.8 C)  SpO2: 94%  Weight: 142 lb (64.4 kg)  Height: 5\' 6"  (1.676 m)   Body mass index is 22.92 kg/m.  Physical Exam HENT:     Head: Normocephalic.  Eyes:     Extraocular Movements: Extraocular movements intact.     Pupils: Pupils are equal, round, and reactive to light.  Neck:     Vascular: Carotid bruit present.  Cardiovascular:     Rate and Rhythm: Normal rate.     Pulses:          Femoral pulses are 2+ on the right side and 2+ on the  left side.      Popliteal pulses are 2+ on the left side.       Dorsalis pedis pulses are 2+ on the left side.     Comments: Right aka Pulmonary:     Effort: Pulmonary effort is normal.     Breath sounds: Normal breath sounds.  Abdominal:     General: Abdomen is flat.     Palpations: Abdomen is soft. There is no mass.  Musculoskeletal:        General: No swelling. Normal range of motion.  Skin:    General: Skin is warm and dry.     Capillary Refill: Capillary refill takes less than 2 seconds.  Neurological:     General: No focal deficit present.     Mental Status: He is alert.  Psychiatric:        Mood and Affect: Mood normal.        Behavior: Behavior normal.        Thought Content: Thought content normal.        Judgment: Judgment normal.     Data: +---------+---------------+---------+-----------+----------+--------------+   LEFT   CompressibilityPhasicitySpontaneityPropertiesThrombus  Aging  +---------+---------------+---------+-----------+----------+--------------+   CFV   Full      Yes   Yes                    +---------+---------------+---------+-----------+----------+--------------+   SFJ   Full                                 +---------+---------------+---------+-----------+----------+--------------+   FV Prox Full                                 +---------+---------------+---------+-----------+----------+--------------+   FV Mid  Full                                 +---------+---------------+---------+-----------+----------+--------------+   FV DistalFull                                 +---------+---------------+---------+-----------+----------+--------------+   PFV   Full                                  +---------+---------------+---------+-----------+----------+--------------+   POP   Full      Yes   Yes                    +---------+---------------+---------+-----------+----------+--------------+  PTV   Full                                 +---------+---------------+---------+-----------+----------+--------------+   PERO   Full                                 +---------+---------------+---------+-----------+----------+--------------+   GSV   Full                                 +---------+---------------+---------+-----------+----------+--------------+  Summary:   LEFT:  - There is no evidence of deep vein thrombosis in the lower extremity.  - There is no evidence of superficial venous thrombosis.      Assessment/Plan:     50 year old male with previous right above-knee amputation for what appears to have been acute right lower extremity ischemia.  He also has history of DVT and also multiple previous pulmonary embolus.  From the standpoint I believe he should probably be on Eliquis indefinitely although I am unsure that he has had any hypercoagulable work-up.  He does have a right carotid bruit and a palpable left anterior tibial pulse of the ankle.  We will get him back and check ABIs on the left and try duplexes in 6 months.  I have counseled him on smoking cessation which I do believe to be the most important component to his medical care at this time.  He demonstrates good understanding in the presence of a friend and his dog today.     Maeola Harman MD [facility]

## 2021-03-18 ENCOUNTER — Other Ambulatory Visit: Payer: Self-pay

## 2021-03-18 ENCOUNTER — Ambulatory Visit: Payer: Medicaid Other | Attending: Family Medicine | Admitting: Family Medicine

## 2021-03-18 ENCOUNTER — Encounter: Payer: Self-pay | Admitting: Family Medicine

## 2021-03-18 VITALS — BP 133/83 | HR 86 | Ht 66.0 in | Wt 147.2 lb

## 2021-03-18 DIAGNOSIS — F17210 Nicotine dependence, cigarettes, uncomplicated: Secondary | ICD-10-CM | POA: Diagnosis not present

## 2021-03-18 DIAGNOSIS — I2699 Other pulmonary embolism without acute cor pulmonale: Secondary | ICD-10-CM | POA: Diagnosis not present

## 2021-03-18 DIAGNOSIS — Z7901 Long term (current) use of anticoagulants: Secondary | ICD-10-CM | POA: Diagnosis not present

## 2021-03-18 DIAGNOSIS — Z7982 Long term (current) use of aspirin: Secondary | ICD-10-CM | POA: Insufficient documentation

## 2021-03-18 DIAGNOSIS — Z89611 Acquired absence of right leg above knee: Secondary | ICD-10-CM | POA: Diagnosis not present

## 2021-03-18 DIAGNOSIS — Z86718 Personal history of other venous thrombosis and embolism: Secondary | ICD-10-CM | POA: Diagnosis not present

## 2021-03-18 DIAGNOSIS — M25559 Pain in unspecified hip: Secondary | ICD-10-CM | POA: Diagnosis not present

## 2021-03-18 DIAGNOSIS — R0989 Other specified symptoms and signs involving the circulatory and respiratory systems: Secondary | ICD-10-CM

## 2021-03-18 DIAGNOSIS — I10 Essential (primary) hypertension: Secondary | ICD-10-CM | POA: Insufficient documentation

## 2021-03-18 DIAGNOSIS — Z79899 Other long term (current) drug therapy: Secondary | ICD-10-CM | POA: Insufficient documentation

## 2021-03-18 DIAGNOSIS — Z720 Tobacco use: Secondary | ICD-10-CM

## 2021-03-18 MED ORDER — NICOTINE 14 MG/24HR TD PT24
14.0000 mg | MEDICATED_PATCH | Freq: Every day | TRANSDERMAL | 1 refills | Status: AC
  Filled 2021-03-18: qty 28, 28d supply, fill #0

## 2021-03-18 MED ORDER — CYCLOBENZAPRINE HCL 10 MG PO TABS: 10.0000 mg | ORAL_TABLET | Freq: Three times a day (TID) | ORAL | 1 refills | Status: DC | PRN

## 2021-03-18 MED ORDER — APIXABAN 5 MG PO TABS
5.0000 mg | ORAL_TABLET | Freq: Two times a day (BID) | ORAL | 6 refills | Status: AC
  Filled 2021-03-18: qty 60, 30d supply, fill #0

## 2021-03-18 MED ORDER — CYCLOBENZAPRINE HCL 10 MG PO TABS
10.0000 mg | ORAL_TABLET | Freq: Three times a day (TID) | ORAL | 1 refills | Status: AC | PRN
  Filled 2021-03-18: qty 30, 10d supply, fill #0

## 2021-03-18 NOTE — Patient Instructions (Signed)

## 2021-03-18 NOTE — Progress Notes (Signed)
Subjective:  Patient ID: Michael Farrell., male    DOB: 08/04/1971  Age: 50 y.o. MRN: [id number]  CC: Follow-up   HPI Michael Farrell. . 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  Previous history of right lower extremity ischemia, schizophrenia, bipolar disorder, right AKA who presents today for power wheelchair evaluation. He informs me that an old friend of his gave him an old power wheelchair which will suffice.  He fell from his power wheelchair and landed on his sacrum for which she was seen at the ED on 03/05/2021.  Pelvic x-ray revealed no acute findings.  He received a few Oxycodone #10 tabs which he he has run out of. Pain is a 6/10. He had a visit with Dr. Gilmer Mor of vascular surgery and notes reviewed.  The patient is not happy that he did not get any x-rays or procedures ordered by vascular but states "he just talked to me". Vascular notes reveal plans for ABI and duplex in 6 months. He smokes less than a pack of cigarettes per day. Past Medical History:  Diagnosis Date  . Anxiety   . Bipolar disorder (HCC)   . COPD (chronic obstructive pulmonary disease) (HCC)   . Depression   . DVT (deep venous thrombosis) (HCC)   . Hypertension   . PTSD (post-traumatic stress disorder)   . Pulmonary embolus (HCC)   . Schizophrenia [facility])     Past Surgical History:  Procedure Laterality Date  . CLAVICLE SURGERY     pt unsure whether it was left or right; has been broken "a couple times"  . LEG AMPUTATION      Family History  Family history unknown: Yes    No Known Allergies  Outpatient Medications Prior to Visit  Medication Sig Dispense Refill  . aspirin EC 81 MG tablet Take 1 tablet (81 mg total) by mouth daily. 30 tablet 0  . escitalopram (LEXAPRO) 20 MG tablet Take 1 tablet (20 mg total) by mouth daily. For depression 30 tablet 2  . gabapentin (NEURONTIN) 400 MG capsule Take 1 capsule (400 mg  total) by mouth 3 (three) times daily. 45 capsule 6  . hydrOXYzine (ATARAX/VISTARIL) 25 MG tablet Take 1 tablet (25 mg total) by mouth 3 (three) times daily as needed for anxiety. 75 tablet 1  . metoprolol tartrate (LOPRESSOR) 25 MG tablet Take 0.5 tablets (12.5 mg total) by mouth 2 (two) times daily. For high blood pressure 60 tablet 6  . Misc. Devices MISC Right leg prosthesis.  Diagnosis right AKA 1 each 0  . OLANZapine (ZYPREXA) 10 MG tablet Take 1 tablet (10 mg total) by mouth at bedtime. 30 tablet 2  . apixaban (ELIQUIS) 5 MG TABS tablet Take 1 tablet (5 mg total) by mouth 2 (two) times daily. 60 tablet 0  . cyclobenzaprine (FLEXERIL) 10 MG tablet Take 1 tablet (10 mg total) by mouth 3 (three) times daily as needed for muscle spasms. 30 tablet 0  . oxyCODONE-acetaminophen (PERCOCET) 5-325 MG tablet Take 1 tablet by mouth every 6 (six) hours as needed for severe pain. (Patient not taking: Reported on 03/18/2021) 10 tablet 0   No facility-administered medications prior to visit.     ROS Review of Systems  Constitutional: Negative for activity change and appetite change.  HENT: Negative for sinus pressure and sore throat.   Eyes: Negative for visual disturbance.  Respiratory: Negative for cough, chest tightness and shortness  of breath.   Cardiovascular: Negative for chest pain and leg swelling.  Gastrointestinal: Negative for abdominal distention, abdominal pain, constipation and diarrhea.  Endocrine: Negative.   Genitourinary: Negative for dysuria.  Musculoskeletal: Negative for joint swelling and myalgias.  Skin: Negative for rash.  Allergic/Immunologic: Negative.   Neurological: Negative for weakness, light-headedness and numbness.  Psychiatric/Behavioral: Negative for dysphoric mood and suicidal ideas.    Objective:  BP 133/83 (BP Location: Left Arm)   Pulse 86   Ht 5\' 6"  (1.676 m)   Wt 147 lb 3.2 oz (66.8 kg)   SpO2 97%   BMI 23.76 kg/m   BP/Weight 03/18/2021 03/14/2021  03/05/2021  Systolic BP 133 100 112  Diastolic BP 83 68 70  Wt. (Lbs) 147.2 142 -  BMI 23.76 22.92 -  Some encounter information is confidential and restricted. Go to Review Flowsheets activity to see all data.      Physical Exam Constitutional:      Appearance: He is well-developed.  Neck:     Vascular: No JVD.  Cardiovascular:     Rate and Rhythm: Normal rate.     Heart sounds: Normal heart sounds. No murmur heard.   Pulmonary:     Effort: Pulmonary effort is normal.     Breath sounds: Normal breath sounds. No wheezing or rales.  Chest:     Chest wall: No tenderness.  Abdominal:     General: Bowel sounds are normal. There is no distension.     Palpations: Abdomen is soft. There is no mass.     Tenderness: There is no abdominal tenderness.  Musculoskeletal:     Left lower leg: No edema.     Comments: R AKA Bony prominence on lateral aspect of R lumbar region, no tenderness to palpation  Neurological:     Mental Status: He is alert and oriented to person, place, and time.  Psychiatric:        Mood and Affect: Mood normal.     CMP Latest Ref Rng & Units 02/11/2021 11/18/2020 09/02/2020  Glucose 65 - 99 mg/dL 73 09/04/2020) -  BUN 6 - 24 mg/dL 8 12 -  Creatinine 673(A - 1.27 mg/dL 1.93) 7.90(W -  Sodium 134 - 144 mmol/L 141 141 -  Potassium 3.5 - 5.2 mmol/L 4.0 3.2(L) -  Chloride 96 - 106 mmol/L 101 102 -  CO2 20 - 29 mmol/L 25 26 -  Calcium 8.7 - 10.2 mg/dL 9.5 9.3 -  Total Protein 6.5 - 8.1 g/dL - 7.3 7.0  Total Bilirubin 0.3 - 1.2 mg/dL - 0.7 0.4  Alkaline Phos 38 - 126 U/L - 64 77  AST 15 - 41 U/L - 23 39  ALT 0 - 44 U/L - 23 43    Lipid Panel  No results found for: CHOL, TRIG, HDL, CHOLHDL, VLDL, LDLCALC, LDLDIRECT  CBC    Component Value Date/Time   WBC 11.9 (H) 11/18/2020 2017   RBC 4.78 11/18/2020 2017   HGB 14.2 11/18/2020 2017   HCT 41.2 11/18/2020 2017   PLT 390 11/18/2020 2017   MCV 86.2 11/18/2020 2017   MCH 29.7 11/18/2020 2017   MCHC 34.5  11/18/2020 2017   RDW 14.2 11/18/2020 2017   LYMPHSABS 3.7 11/18/2020 2017   MONOABS 0.5 11/18/2020 2017   EOSABS 0.1 11/18/2020 2017   BASOSABS 0.0 11/18/2020 2017    No results found for: HGBA1C  Assessment & Plan:  1. Recurrent pulmonary embolism (HCC) She will remain on indefinite anticoagulation -  apixaban (ELIQUIS) 5 MG TABS tablet; Take 1 tablet (5 mg total) by mouth 2 (two) times daily.  Dispense: 60 tablet; Refill: 6  2. Tobacco abuse Spent 3 minutes counseling on smoking cessation and he is willing to work on quitting We will place on NicoDerm - nicotine (NICODERM CQ) 14 mg/24hr patch; Place 1 patch (14 mg total) onto the skin daily. For 1 month, then 68mcg/24 hr daily  Dispense: 28 patch; Refill: 1  3. Hip pain Secondary to fall Severity of pain does not warrant prescription of opioid analgesics at this time We will place on Flexeril - cyclobenzaprine (FLEXERIL) 10 MG tablet; Take 1 tablet (10 mg total) by mouth 3 (three) times daily as needed for muscle spasms.  Dispense: 30 tablet; Refill: 1   No indication for power wheelchair evaluation given he already received one from a friend of his. Meds ordered this encounter  Medications  . apixaban (ELIQUIS) 5 MG TABS tablet    Sig: Take 1 tablet (5 mg total) by mouth 2 (two) times daily.    Dispense:  60 tablet    Refill:  6  . DISCONTD: cyclobenzaprine (FLEXERIL) 10 MG tablet    Sig: Take 1 tablet (10 mg total) by mouth 3 (three) times daily as needed for muscle spasms.    Dispense:  30 tablet    Refill:  1  . nicotine (NICODERM CQ) 14 mg/24hr patch    Sig: Place 1 patch (14 mg total) onto the skin daily. For 1 month, then 71mcg/24 hr daily    Dispense:  28 patch    Refill:  1  . cyclobenzaprine (FLEXERIL) 10 MG tablet    Sig: Take 1 tablet (10 mg total) by mouth 3 (three) times daily as needed for muscle spasms.    Dispense:  30 tablet    Refill:  1    Follow-up: Return in about 6 months (around 09/17/2021) for  Chronic medical conditions.       Hoy Register, MD, FAAFP. [facility] [place], [place] [phone]   03/18/2021, 12:48 PM

## 2021-03-18 NOTE — Progress Notes (Signed)
Power wheelchair evaluation Hip pain as well

## 2021-03-19 ENCOUNTER — Other Ambulatory Visit: Payer: Self-pay

## 2021-04-01 ENCOUNTER — Other Ambulatory Visit: Payer: Self-pay

## 2021-04-01 ENCOUNTER — Encounter (HOSPITAL_COMMUNITY): Payer: Self-pay | Admitting: Licensed Clinical Social Worker

## 2021-04-01 ENCOUNTER — Ambulatory Visit (INDEPENDENT_AMBULATORY_CARE_PROVIDER_SITE_OTHER): Payer: Medicaid Other | Admitting: Licensed Clinical Social Worker

## 2021-04-01 DIAGNOSIS — F332 Major depressive disorder, recurrent severe without psychotic features: Secondary | ICD-10-CM

## 2021-04-01 DIAGNOSIS — F259 Schizoaffective disorder, unspecified: Secondary | ICD-10-CM | POA: Diagnosis not present

## 2021-04-01 DIAGNOSIS — F1911 Other psychoactive substance abuse, in remission: Secondary | ICD-10-CM | POA: Insufficient documentation
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
## 2021-04-02 ENCOUNTER — Ambulatory Visit (INDEPENDENT_AMBULATORY_CARE_PROVIDER_SITE_OTHER): Payer: Medicaid Other | Admitting: Physician Assistant

## 2021-04-02 ENCOUNTER — Encounter (HOSPITAL_COMMUNITY): Payer: Self-pay | Admitting: Physician Assistant

## 2021-04-02 VITALS — BP 121/84 | HR 92

## 2021-04-02 DIAGNOSIS — F332 Major depressive disorder, recurrent severe without psychotic features: Secondary | ICD-10-CM | POA: Insufficient documentation

## 2021-04-02 DIAGNOSIS — F250 Schizoaffective disorder, bipolar type: Secondary | ICD-10-CM

## 2021-04-02 DIAGNOSIS — F15951 Other stimulant use, unspecified with stimulant-induced psychotic disorder with hallucinations: Secondary | ICD-10-CM | POA: Diagnosis not present

## 2021-04-02 DIAGNOSIS — F411 Generalized anxiety disorder: Secondary | ICD-10-CM | POA: Diagnosis not present

## 2021-04-02 DIAGNOSIS — F331 Major depressive disorder, recurrent, moderate: Secondary | ICD-10-CM

## 2021-04-02 DIAGNOSIS — F4310 Post-traumatic stress disorder, unspecified: Secondary | ICD-10-CM | POA: Diagnosis not present

## 2021-04-02 MED ORDER — ESCITALOPRAM OXALATE 20 MG PO TABS: 20.0000 mg | ORAL_TABLET | Freq: Every day | ORAL | 1 refills | Status: AC

## 2021-04-02 MED ORDER — HYDROXYZINE HCL 25 MG PO TABS: 25.0000 mg | ORAL_TABLET | Freq: Three times a day (TID) | ORAL | 1 refills | Status: AC | PRN

## 2021-04-02 MED ORDER — OLANZAPINE 15 MG PO TABS: 15.0000 mg | ORAL_TABLET | Freq: Every day | ORAL | 1 refills | Status: AC

## 2021-04-02 NOTE — Progress Notes (Signed)
BH MD/PA/NP OP Progress Note  04/02/2021 8:03 PM Michael Farrell.  MRN:  [id number]  Chief Complaint:  Chief Complaint    Medication Management     HPI:   Michael Farrell. is a 50 year old male with a past psychiatric history significant for schizoaffective disorder (bipolar type), generalized anxiety disorder, and major depressive disorder who presents to [facility] for follow-up and medication management.  Patient is currently being managed on the following medications:  Olanzapine 10 mg at bedtime Escitalopram 20 mg daily Hydroxyzine 25 mg 3 times daily  Patient reports experiencing active hallucinations that have occurred since Saturday night.  Patient reports that while he was sleeping Saturday night, he woke up at 1 AM to visions of people around the house which frightened the patient.  Patient ended up going back to sleep and experienced no other disturbances related to hallucinations over the night.  Patient continues to experience auditory hallucinations characterized by voices calling him names and being subjected to insults such as "punk" and "bitch."  Patient reports that he has been taking his medications as scheduled and has no other issues or concerns at this time.  A PHQ-9 screen was performed with the patient scoring a 17.  A GAD-7 screen was also performed with the patient scoring at 17.  Patient is calm, cooperative, and fully engaged in conversation during the encounter.  Patient reports that he is worried about his worsening hallucinations but he is trying to manage the best he can.  Patient denies suicidal or homicidal ideations.  Patient endorses active auditory hallucinations characterized by degrading thoughts but denies any current visual hallucinations.  Patient endorses good sleep and receives on average 7 to 8 hours of sleep each night.  Patient endorses appetite and has at least 1 meal per day.  Patient denies alcohol  consumption and illicit drug use.  Patient endorses tobacco use and smokes very few cigarettes per day.  Visit Diagnosis:    ICD-10-CM   1. 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 Schizoaffective disorder, bipolar type (HCC)  F25.0 OLANZapine (ZYPREXA) 15 MG tablet  3. PTSD (post-traumatic stress disorder)  F43.10 escitalopram (LEXAPRO) 20 MG tablet  4. Generalized anxiety disorder  F41.1 escitalopram (LEXAPRO) 20 MG tablet    hydrOXYzine (ATARAX/VISTARIL) 25 MG tablet  5. Moderate episode of recurrent major depressive disorder (HCC)  F33.1 escitalopram (LEXAPRO) 20 MG tablet    Past Psychiatric History:  Schizoaffective disorder (bipolar type) PTSD Generalized anxiety disorder Major depressive disorder  Past Medical History:  Past Medical History:  Diagnosis Date  . Anxiety   . Bipolar disorder (HCC)   . COPD (chronic obstructive pulmonary disease) (HCC)   . Depression   . DVT (deep venous thrombosis) (HCC)   . Hypertension   . PTSD (post-traumatic stress disorder)   . Pulmonary embolus (HCC)   . Schizophrenia [facility])     Past Surgical History:  Procedure Laterality Date  . CLAVICLE SURGERY     pt unsure whether it was left or right; has been broken "a couple times"  . LEG AMPUTATION      Family Psychiatric History:  Patient states that he has not been around his family enough to determine if they have any psychiatric conditions.  Family History:  Family History  Family history unknown: Yes    Social History:  Social History   Socioeconomic History  . Marital status: Legally Separated  Spouse name: Not on file  . Number of children: 0  . Years of education: Not on file  . Highest education level: GED or equivalent  Occupational History  . Occupation: unemployed  Tobacco Use  . Smoking status: Current Every Day Smoker    Packs/day: 0.50    Years: 42.00    Pack years: 21.00    Types:  Cigarettes  . Smokeless tobacco: Former Neurosurgeon  . Tobacco comment: It has been hard to stop smoking  Vaping Use  . Vaping Use: Never used  Substance and Sexual Activity  . Alcohol use: Not Currently    Comment: 2 drinks in the past 6 months  . Drug use: Not Currently    Types: Methamphetamines    Comment: Lat used 6 month ago.   Marland Kitchen Sexual activity: Not Currently  Other Topics Concern  . Not on file  Social History Narrative  . Not on file   Social Determinants of Health   Financial Resource Strain: High Risk  . Difficulty of Paying Living Expenses: Hard  Food Insecurity: Food Insecurity Present  . Worried About Programme researcher, broadcasting/film/video in the Last Year: Sometimes true  . Ran Out of Food in the Last Year: Sometimes true  Transportation Needs: No Transportation Needs  . Lack of Transportation (Medical): No  . Lack of Transportation (Non-Medical): No  Physical Activity: Inactive  . Days of Exercise per Week: 0 days  . Minutes of Exercise per Session: 0 min  Stress: Stress Concern Present  . Feeling of Stress : Very much  Social Connections: Socially Isolated  . Frequency of Communication with Friends and Family: Never  . Frequency of Social Gatherings with Friends and Family: Never  . Attends Religious Services: Never  . Active Member of Clubs or Organizations: No  . Attends Banker Meetings: Never  . Marital Status: Separated    Allergies: No Known Allergies  Metabolic Disorder Labs: No results found for: HGBA1C, MPG No results found for: PROLACTIN No results found for: CHOL, TRIG, HDL, CHOLHDL, VLDL, LDLCALC No results found for: TSH  Therapeutic Level Labs: No results found for: LITHIUM No results found for: VALPROATE No components found for:  CBMZ  Current Medications: Current Outpatient Medications  Medication Sig Dispense Refill  . apixaban (ELIQUIS) 5 MG TABS tablet Take 1 tablet (5 mg total) by mouth 2 (two) times daily. 60 tablet 6  . apixaban  (ELIQUIS) 5 MG TABS tablet TAKE 1 TABLET (5 MG TOTAL) BY MOUTH 2 (TWO) TIMES DAILY. 60 tablet 0  . aspirin EC 81 MG tablet Take 1 tablet (81 mg total) by mouth daily. 30 tablet 0  . cyclobenzaprine (FLEXERIL) 10 MG tablet Take 1 tablet (10 mg total) by mouth 3 (three) times daily as needed for muscle spasms. 30 tablet 1  . escitalopram (LEXAPRO) 20 MG tablet Take 1 tablet (20 mg total) by mouth daily. 30 tablet 1  . gabapentin (NEURONTIN) 400 MG capsule Take 1 capsule (400 mg total) by mouth 3 (three) times daily. 45 capsule 6  . gabapentin (NEURONTIN) 400 MG capsule TAKE 1 CAPSULE (400 MG TOTAL) BY MOUTH 3 (THREE) TIMES DAILY. 45 capsule 6  . hydrOXYzine (ATARAX/VISTARIL) 25 MG tablet Take 1 tablet (25 mg total) by mouth 3 (three) times daily as needed for anxiety. 75 tablet 1  . metoprolol tartrate (LOPRESSOR) 25 MG tablet Take 0.5 tablets (12.5 mg total) by mouth 2 (two) times daily. For high blood pressure 60 tablet 6  .  metoprolol tartrate (LOPRESSOR) 25 MG tablet TAKE 0.5 TABLETS (12.5 MG TOTAL) BY MOUTH 2 (TWO) TIMES DAILY. FOR HIGH BLOOD PRESSURE 60 tablet 6  . Misc. Devices MISC Right leg prosthesis.  Diagnosis right AKA 1 each 0  . nicotine (NICODERM CQ) 14 mg/24hr patch Place 1 patch (14 mg total) onto the skin daily. For 1 month, then 1007mcg/24 hr daily 28 patch 1  . OLANZapine (ZYPREXA) 15 MG tablet Take 1 tablet (15 mg total) by mouth at bedtime. 30 tablet 1  . oxyCODONE-acetaminophen (PERCOCET) 5-325 MG tablet Take 1 tablet by mouth every 6 (six) hours as needed for severe pain. (Patient not taking: Reported on 03/18/2021) 10 tablet 0   No current facility-administered medications for this visit.     Musculoskeletal: Strength & Muscle Tone: within normal limits Gait & Station: Patient's right leg is amputated. Patient ambulates through use of a wheel chair Patient leans: N/A  Psychiatric Specialty Exam: Review of Systems  Psychiatric/Behavioral: Positive for dysphoric mood and  hallucinations. Negative for decreased concentration, self-injury, sleep disturbance and suicidal ideas. The patient is nervous/anxious. The patient is not hyperactive.     Blood pressure 121/84, pulse 92, SpO2 97 %.There is no height or weight on file to calculate BMI.  General Appearance: Fairly Groomed  Eye Contact:  Good  Speech:  Clear and Coherent and Normal Rate  Volume:  Normal  Mood:  Anxious, Depressed and Dysphoric  Affect:  Appropriate and Depressed  Thought Process:  Coherent, Goal Directed and Descriptions of Associations: Intact  Orientation:  Full (Time, Place, and Person)  Thought Content: WDL and Hallucinations: Auditory   Suicidal Thoughts:  No  Homicidal Thoughts:  No  Memory:  Immediate;   Good Recent;   Fair Remote;   Fair  Judgement:  Fair  Insight:  Fair  Psychomotor Activity:  Restlessness  Concentration:  Concentration: Good and Attention Span: Good  Recall:  FiservFair  Fund of Knowledge: Fair  Language: Good  Akathisia:  NA  Handed:  Right  AIMS (if indicated): not done  Assets:  Communication Skills Desire for Improvement Housing  ADL's:  Intact  Cognition: WNL  Sleep:  Fair   Screenings: AIMS   Flowsheet Row Admission (Discharged) from OP Visit from 05/26/2020 [facility] Ad[facility] (Dis[facility]) [facility]  AIMS Total Score 0 0    AUDIT   Flowsheet Row Admission (Discharged) from OP Visit from 05/26/2020 in [facility] Admission (Discharged) from [facility]2020 [facility]  Alcohol Use Disorder Identification Test Final Score (AUDIT) 5 1    GAD-7   Flowsheet Row [facility]  Total GAD-7 Score 15 20 21     PHQ2-9    Flowsheet Row Clinical [facility]  PHQ-2 Total Score 5 4 5 5 4   PHQ-9 Total Score 17 13 22 22 20     Flowsheet Row Clinical [facility]  [facility]  C-SSRS RISK CATEGORY Low Risk Low Risk High Risk      Assessment and Plan:   Michael Farrell. is a 50 year old male with a past psychiatric history significant for schizoaffective disorder (bipolar type), generalized anxiety disorder, and major depressive disorder who presents to [facility] for follow-up and medication management.  Patient reports that he has been experiencing auditory hallucinations characterized by voices insulting him.  Patient denies the auditory hallucinations being command in nature and further denies visual hallucinations.  Patient was recommended increasing his dosage of olanzapine from 10 mg to 15 mg at bedtime and the management of his auditory/visual hallucinations.  All other medications will be left unchanged following the conclusion of the encounter.  Patient is agreeable to plan going forward.  Patient's medications will be e-prescribed to his pharmacy of choice.  1. 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  Dispense: 30 tablet; Refill: 1  2. Schizoaffective disorder, bipolar type (HCC)  - OLANZapine (ZYPREXA) 15 MG tablet; Take 1 tablet (15 mg total)  by mouth at bedtime.  Dispense: 30 tablet; Refill: 1  3. PTSD (post-traumatic stress disorder)  - escitalopram (LEXAPRO) 20 MG tablet; Take 1 tablet (20 mg total) by mouth daily.  Dispense: 30 tablet; Refill: 1  4. Generalized anxiety disorder  - escitalopram (LEXAPRO) 20 MG tablet; Take 1 tablet (20 mg total) by mouth daily.  Dispense: 30 tablet; Refill: 1 - hydrOXYzine (ATARAX/VISTARIL) 25 MG tablet; Take 1 tablet (25 mg total) by mouth 3 (three) times daily as needed for anxiety.  Dispense: 75 tablet; Refill: 1  5. Moderate episode of recurrent major depressive disorder (HCC)  - escitalopram (LEXAPRO) 20 MG tablet; Take 1 tablet (20 mg total) by mouth daily.  Dispense: 30 tablet; Refill: 1  Patient to follow up in 2 months  Meta Hatchet, PA 04/02/2021, 8:03 PM

## 2021-04-10 ENCOUNTER — Ambulatory Visit (INDEPENDENT_AMBULATORY_CARE_PROVIDER_SITE_OTHER): Payer: Medicaid Other | Admitting: Behavioral Health

## 2021-04-10 ENCOUNTER — Other Ambulatory Visit: Payer: Self-pay

## 2021-04-10 DIAGNOSIS — F1510 Other stimulant abuse, uncomplicated: Secondary | ICD-10-CM

## 2021-04-10 NOTE — Progress Notes (Signed)
Michael Farrell is a 50 year old male who presents to [facility] outpatient for a scheduled [facility]IOP intake appointment with this Probation officer. 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 Client reports "Monday" as his date of last use (Route of use: smoking). Client was assessed on 04/01/2021 and meets criteria for ASAM Level 2.1 treatment. Michael Farrell is being recommended for Substance Abuse Intensive Outpatient Program [facility]). Client completed a person-centered treatment plan and a comprehensive prevention & crisis plan with this Probation officer. Client has agreed to attend [place] group therapy on Mondays, Wednesdays, and Fridays from 9:00am - 12:00pm. Group rules and group expectations were discussed with client during today's session. Client did not express any concerns regarding group rules and expectations.  In assessing clt's recent drug use, he reports "I can make a dime last 3 days. I only do it (smoke crystal meth) because of the pain in my leg". Clt was not receptive to the idea of detox at this time; however, he expressed understanding that in the event he has 2 failed urine drugs screens while enrolled in SAIOP, he will be referred to a higher level of care (detox). Clt denied Michael/HI; however, he reports auditory hallucinations without command. Clt reports he is under a lot of stress due to his housing situation as he is currently chronically homeless (living in a tent). Therapist discussed [place] of Needs and discussed with client his basic needs being met as his primary goal.   SAIOP Start Date:  04/14/2021

## 2021-04-14 ENCOUNTER — Ambulatory Visit (HOSPITAL_COMMUNITY): Payer: Medicaid Other | Admitting: Behavioral Health

## 2021-04-16 ENCOUNTER — Ambulatory Visit (HOSPITAL_COMMUNITY): Payer: Self-pay | Admitting: Behavioral Health

## 2021-04-18 ENCOUNTER — Ambulatory Visit (HOSPITAL_COMMUNITY): Payer: Self-pay | Admitting: Behavioral Health

## 2021-04-21 ENCOUNTER — Ambulatory Visit (HOSPITAL_COMMUNITY): Payer: Self-pay | Admitting: Behavioral Health

## 2021-04-23 ENCOUNTER — Ambulatory Visit (HOSPITAL_COMMUNITY): Payer: Self-pay | Admitting: Behavioral Health

## 2021-04-25 ENCOUNTER — Ambulatory Visit: Payer: Self-pay | Admitting: *Deleted

## 2021-04-25 ENCOUNTER — Emergency Department (HOSPITAL_COMMUNITY)
Admission: EM | Admit: 2021-04-25 | Discharge: 2021-04-26 | Disposition: A | Discharge status: Home / Self Care | Payer: Medicaid Other | Attending: Emergency Medicine | Admitting: Emergency Medicine

## 2021-04-25 ENCOUNTER — Other Ambulatory Visit: Payer: Self-pay

## 2021-04-25 ENCOUNTER — Emergency Department (HOSPITAL_COMMUNITY): Payer: Medicaid Other

## 2021-04-25 ENCOUNTER — Encounter (HOSPITAL_COMMUNITY): Payer: Self-pay | Admitting: Emergency Medicine

## 2021-04-25 ENCOUNTER — Ambulatory Visit (HOSPITAL_COMMUNITY)
Admission: EM | Admit: 2021-04-25 | Discharge: 2021-04-25 | Disposition: A | Discharge status: Home / Self Care | Payer: Medicaid Other

## 2021-04-25 ENCOUNTER — Ambulatory Visit (HOSPITAL_COMMUNITY): Payer: Self-pay | Admitting: Behavioral Health

## 2021-04-25 DIAGNOSIS — Z20822 Contact with and (suspected) exposure to covid-19: Secondary | ICD-10-CM | POA: Diagnosis not present

## 2021-04-25 DIAGNOSIS — F17210 Nicotine dependence, cigarettes, uncomplicated: Secondary | ICD-10-CM | POA: Diagnosis not present

## 2021-04-25 DIAGNOSIS — Z79899 Other long term (current) drug therapy: Secondary | ICD-10-CM | POA: Insufficient documentation

## 2021-04-25 DIAGNOSIS — I10 Essential (primary) hypertension: Secondary | ICD-10-CM | POA: Insufficient documentation

## 2021-04-25 DIAGNOSIS — R519 Headache, unspecified: Secondary | ICD-10-CM | POA: Diagnosis present

## 2021-04-25 DIAGNOSIS — J069 Acute upper respiratory infection, unspecified: Secondary | ICD-10-CM

## 2021-04-25 DIAGNOSIS — H538 Other visual disturbances: Secondary | ICD-10-CM | POA: Diagnosis not present

## 2021-04-25 DIAGNOSIS — B349 Viral infection, unspecified: Secondary | ICD-10-CM | POA: Diagnosis not present

## 2021-04-25 DIAGNOSIS — Z7901 Long term (current) use of anticoagulants: Secondary | ICD-10-CM | POA: Insufficient documentation

## 2021-04-25 DIAGNOSIS — J449 Chronic obstructive pulmonary disease, unspecified: Secondary | ICD-10-CM | POA: Insufficient documentation

## 2021-04-25 DIAGNOSIS — R000 Tachycardia, unspecified: Secondary | ICD-10-CM | POA: Diagnosis not present

## 2021-04-25 DIAGNOSIS — Z7982 Long term (current) use of aspirin: Secondary | ICD-10-CM | POA: Diagnosis not present

## 2021-04-25 LAB — CBC WITH DIFFERENTIAL/PLATELET
Abs Immature Granulocytes: 0.05 10*3/uL (ref 0.00–0.07)
Basophils Absolute: 0 10*3/uL (ref 0.0–0.1)
Basophils Relative: 0 %
Eosinophils Absolute: 0.1 10*3/uL (ref 0.0–0.5)
Eosinophils Relative: 1 %
HCT: 42.6 % (ref 39.0–52.0)
Hemoglobin: 14 g/dL (ref 13.0–17.0)
Immature Granulocytes: 1 %
Lymphocytes Relative: 28 %
Lymphs Abs: 2.7 10*3/uL (ref 0.7–4.0)
MCH: 29 pg (ref 26.0–34.0)
MCHC: 32.9 g/dL (ref 30.0–36.0)
MCV: 88.4 fL (ref 80.0–100.0)
Monocytes Absolute: 1.3 10*3/uL — ABNORMAL HIGH (ref 0.1–1.0)
Monocytes Relative: 13 %
Neutro Abs: 5.7 10*3/uL (ref 1.7–7.7)
Neutrophils Relative %: 57 %
Platelets: 316 10*3/uL (ref 150–400)
RBC: 4.82 MIL/uL (ref 4.22–5.81)
RDW: 13.3 % (ref 11.5–15.5)
WBC: 9.8 10*3/uL (ref 4.0–10.5)
nRBC: 0 % (ref 0.0–0.2)

## 2021-04-25 LAB — COMPREHENSIVE METABOLIC PANEL
ALT: 47 U/L — ABNORMAL HIGH (ref 0–44)
AST: 34 U/L (ref 15–41)
Albumin: 3.4 g/dL — ABNORMAL LOW (ref 3.5–5.0)
Alkaline Phosphatase: 84 U/L (ref 38–126)
Anion gap: 7 (ref 5–15)
BUN: 6 mg/dL (ref 6–20)
CO2: 30 mmol/L (ref 22–32)
Calcium: 8.9 mg/dL (ref 8.9–10.3)
Chloride: 97 mmol/L — ABNORMAL LOW (ref 98–111)
Creatinine, Ser: 0.71 mg/dL (ref 0.61–1.24)
GFR, Estimated: >60 mL/min (ref >60)
Glucose, Bld: 88 mg/dL (ref 70–99)
Potassium: 4.2 mmol/L (ref 3.5–5.1)
Sodium: 134 mmol/L — ABNORMAL LOW (ref 135–145)
Total Bilirubin: 0.3 mg/dL (ref 0.3–1.2)
Total Protein: 6.9 g/dL (ref 6.5–8.1)

## 2021-04-25 LAB — RESP PANEL BY RT-PCR (FLU A&B, COVID) ARPGX2
Influenza A by PCR: NEGATIVE
Influenza B by PCR: NEGATIVE
SARS Coronavirus 2 by RT PCR: NEGATIVE

## 2021-04-25 MED ORDER — SODIUM CHLORIDE 0.9 % IV BOLUS
1000.0000 mL | Freq: Once | INTRAVENOUS | Status: AC
  Administered 2021-04-26: 1000 mL via INTRAVENOUS

## 2021-04-25 MED ORDER — KETOROLAC TROMETHAMINE 30 MG/ML IJ SOLN
30.0000 mg | Freq: Once | INTRAMUSCULAR | Status: AC
  Administered 2021-04-26: 30 mg via INTRAVENOUS
  Filled 2021-04-25: qty 1

## 2021-04-25 NOTE — Telephone Encounter (Signed)
Patient has been experiencing fatigue for roughly three days   Patient had slept for 20 of the past 24 hours, at the time of call with agent   Patient is also feeling throat discomfort and a loss of appetite   Please contact to further advise when possible   Attempted to call patient to discuss symptoms- left message on VM to call office.

## 2021-04-25 NOTE — ED Provider Notes (Signed)
Emergency Medicine Provider Triage Evaluation Note  Michael Farrell , a 50 y.o. male  was evaluated in triage.  Pt complains of headaches, subjective fevers, ear pain, and sore throat.  Symptoms have been present for the last 5 days.  Headache started on Monday and has been constant since then.  Pain is generalized throughout his entire head.  Patient rates pain 7/10 on the pain scale.  Patient reports that pain started gradually and has progressively worsened over time.  Patient endorses occasional blurry vision.  Patient denies any slurred speech, facial asymmetry, focal neurological deficit, persistent nausea and vomiting.      Review of Systems  Positive: Subjective fevers, chills, headache, bilateral ear pain, sore throat, decreased appetite, myalgias Negative: Facial asymmetry, slurred speech, focal neurological deficit, abdominal pain, nausea, vomiting, cough, shortness of breath  Physical Exam  BP 113/85 (BP Location: Right Arm)   Pulse (!) 103   Temp 99.6 F (37.6 C) (Oral)   Resp 16   SpO2 99%  Gen:   Awake, no distress   Resp:  Normal effort, clear to auscultation bilaterally MSK:   Moves extremities without difficulty, right above-knee amputation Other:  No facial asymmetry, slurred speech, oropharynx clear without exudate or erythema, patient able to handle oral secretions without difficulty  Medical Decision Making  Medically screening exam initiated at 1:39 PM.  Appropriate orders placed.  Michael Farrell. was informed that the remainder of the evaluation will be completed by another provider, this initial triage assessment does not replace that evaluation, and the importance of remaining in the ED until their evaluation is complete.  The patient appears stable so that the remainder of the work up may be completed by another provider.      Haskel Schroeder, PA-C 04/25/21 1346    Gilda Crease, MD 04/25/21 [phone]

## 2021-04-25 NOTE — ED Provider Notes (Signed)
[facility] EMERGENCY DEPARTMENT Provider Note   CSN: [id number] Arrival date & time: 04/25/21  1309     History Chief Complaint  Patient presents with  . Headache  . Otalgia  . Fatigue    Michael Farrell. is a 50 y.o. male.  Patient presents to the emergency department for evaluation of fatigue, increased sleep, headache, sore throat, myalgias.  Patient referred from urgent care.  He has been sick for 4 or 5 days.  Patient reports that he has slept for the last 24 hours.  He has not been eating or drinking much.  No nausea, vomiting or diarrhea.  Patient does think he had a tick bite around the time the start of symptoms occurred.        Past Medical History:  Diagnosis Date  . Anxiety   . Bipolar disorder (HCC)   . COPD (chronic obstructive pulmonary disease) (HCC)   . Depression   . DVT (deep venous thrombosis) (HCC)   . Hypertension   . PTSD (post-traumatic stress disorder)   . Pulmonary embolus (HCC)   . Schizophrenia [facility])     Patient Active Problem List   Diagnosis Date Noted  . Severe episode of recurrent major depressive disorder, without psychotic features (HCC) 04/02/2021  . Moderate episode of recurrent major depressive disorder (HCC) 04/02/2021  . History of substance abuse (HCC) 04/01/2021  . Schizoaffective disorder (HCC) 04/01/2021  . Generalized anxiety disorder 02/18/2021  . Schizoaffective disorder, bipolar type (HCC) 02/18/2021  . PTSD (post-traumatic stress disorder) 02/18/2021  . Adjustment disorder with mixed anxiety and depressed mood 11/18/2020  . Substance-induced psychotic disorder (HCC)   . Methamphetamine dependence (HCC)   . Depression 05/17/2020  . MDD (major depressive disorder) 05/17/2020  . MDD (major depressive disorder), recurrent episode, severe (HCC) 05/16/2020  . 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 Moderate benzodiazepine use disorder (HCC)  05/01/2020    Past Surgical History:  Procedure Laterality Date  . CLAVICLE SURGERY     pt unsure whether it was left or right; has been broken "a couple times"  . LEG AMPUTATION         Family History  Family history unknown: Yes    Social History   Tobacco Use  . Smoking status: Current Every Day Smoker    Packs/day: 0.50    Years: 42.00    Pack years: 21.00    Types: Cigarettes  . Smokeless tobacco: Former Neurosurgeon  . Tobacco comment: It has been hard to stop smoking  Vaping Use  . Vaping Use: Never used  Substance Use Topics  . Alcohol use: Not Currently    Comment: 2 drinks in the past 6 months  . Drug use: Not Currently    Types: Methamphetamines    Comment: Lat used 6 month ago.     Home Medications Prior to Admission medications   Medication Sig Start Date End Date Taking? Authorizing Provider  acetaminophen (TYLENOL) 500 MG tablet Take 1,000 mg by mouth every 6 (six) hours as needed for moderate pain or headache.   Yes [provider]  apixaban (ELIQUIS) 5 MG TABS tablet Take 1 tablet (5 mg total) by mouth 2 (two) times daily. 03/18/21  Yes Hoy Register, MD  aspirin EC 81 MG tablet Take 1 tablet (81 mg total) by mouth daily. 09/02/20  Yes Caccavale, Sophia, PA-C  cyclobenzaprine (FLEXERIL) 10 MG tablet Take 1 tablet (10 mg total) by mouth 3 (  three) times daily as needed for muscle spasms. 03/18/21  Yes Newlin, Odette Horns, MD  escitalopram (LEXAPRO) 20 MG tablet Take 1 tablet (20 mg total) by mouth daily. 04/02/21 04/02/22 Yes Nwoko, Tommas Olp, PA  gabapentin (NEURONTIN) 400 MG capsule Take 1 capsule (400 mg total) by mouth 3 (three) times daily. 02/11/21  Yes Hoy Register, MD  hydrOXYzine (ATARAX/VISTARIL) 25 MG tablet Take 1 tablet (25 mg total) by mouth 3 (three) times daily as needed for anxiety. 04/02/21 04/02/22 Yes Nwoko, Stephens Shire E, PA  metoprolol tartrate (LOPRESSOR) 25 MG tablet Take 0.5 tablets (12.5 mg total) by mouth 2 (two) times daily. For high blood  pressure 02/11/21  Yes Hoy Register, MD  Misc. Devices MISC Right leg prosthesis.  Diagnosis right AKA 02/11/21  Yes Newlin, Enobong, MD  nicotine (NICODERM CQ) 14 mg/24hr patch Place 1 patch (14 mg total) onto the skin daily. For 1 month, then 40mcg/24 hr daily 03/18/21  Yes Newlin, Enobong, MD  OLANZapine (ZYPREXA) 15 MG tablet Take 1 tablet (15 mg total) by mouth at bedtime. 04/02/21  Yes Nwoko, Tommas Olp, PA  doxycycline (VIBRAMYCIN) 100 MG capsule Take 1 capsule (100 mg total) by mouth 2 (two) times daily. 04/26/21   Gilda Crease, MD  gabapentin (NEURONTIN) 400 MG capsule TAKE 1 CAPSULE (400 MG TOTAL) BY MOUTH 3 (THREE) TIMES DAILY. Patient taking differently: Take 400 mg by mouth 3 (three) times daily. 02/11/21 02/11/22  Hoy Register, MD  metoprolol tartrate (LOPRESSOR) 25 MG tablet TAKE 0.5 TABLETS (12.5 MG TOTAL) BY MOUTH 2 (TWO) TIMES DAILY. FOR HIGH BLOOD PRESSURE Patient taking differently: Take 12.5 mg by mouth 2 (two) times daily. 02/11/21 02/11/22  Hoy Register, MD  oxyCODONE-acetaminophen (PERCOCET) 5-325 MG tablet Take 1 tablet by mouth every 6 (six) hours as needed for severe pain. Patient not taking: No sig reported 03/05/21   Felicie Morn, NP    Allergies    Patient has no known allergies.  Review of Systems   Review of Systems  Constitutional: Positive for activity change, appetite change and fatigue.  HENT: Positive for sore throat.   Cardiovascular: Positive for chest pain.  Neurological: Positive for headaches.  All other systems reviewed and are negative.   Physical Exam Updated Vital Signs BP 124/89 (BP Location: Right Arm)   Pulse 85   Temp 98.5 F (36.9 C) (Oral)   Resp 15   Ht 5\' 6"  (1.676 m)   Wt 65.8 kg   SpO2 99%   BMI 23.40 kg/m   Physical Exam Vitals and nursing note reviewed.  Constitutional:      General: He is not in acute distress.    Appearance: Normal appearance. He is well-developed.  HENT:     Head: Normocephalic and atraumatic.      Right Ear: Hearing normal.     Left Ear: Hearing normal.     Nose: Nose normal.  Eyes:     Conjunctiva/sclera: Conjunctivae normal.     Pupils: Pupils are equal, round, and reactive to light.  Cardiovascular:     Rate and Rhythm: Regular rhythm. Tachycardia present.     Heart sounds: S1 normal and S2 normal. No murmur heard. No friction rub. No gallop.   Pulmonary:     Effort: Pulmonary effort is normal. No respiratory distress.     Breath sounds: Normal breath sounds.  Chest:     Chest wall: No tenderness.  Abdominal:     General: Bowel sounds are normal.     Palpations:  Abdomen is soft.     Tenderness: There is no abdominal tenderness. There is no guarding or rebound. Negative signs include Murphy's sign and McBurney's sign.     Hernia: No hernia is present.  Musculoskeletal:        General: Normal range of motion.     Cervical back: Normal range of motion and neck supple.  Skin:    General: Skin is warm and dry.     Findings: No rash.  Neurological:     Mental Status: He is alert and oriented to person, place, and time.     GCS: GCS eye subscore is 4. GCS verbal subscore is 5. GCS motor subscore is 6.     Cranial Nerves: No cranial nerve deficit.     Sensory: No sensory deficit.     Coordination: Coordination normal.  Psychiatric:        Speech: Speech normal.        Behavior: Behavior normal.        Thought Content: Thought content normal.     ED Results / Procedures / Treatments   Labs (all labs ordered are listed, but only abnormal results are displayed) Labs Reviewed  COMPREHENSIVE METABOLIC PANEL - Abnormal; Notable for the following components:      Result Value   Sodium 134 (*)    Chloride 97 (*)    Albumin 3.4 (*)    ALT 47 (*)    All other components within normal limits  CBC WITH DIFFERENTIAL/PLATELET - Abnormal; Notable for the following components:   Monocytes Absolute 1.3 (*)    All other components within normal limits  RESP PANEL BY RT-PCR (FLU  A&B, COVID) ARPGX2  TROPONIN I (HIGH SENSITIVITY)  TROPONIN I (HIGH SENSITIVITY)    EKG None  Radiology DG Chest 2 View  Result Date: 04/26/2021 CLINICAL DATA:  Fever EXAM: CHEST - 2 VIEW COMPARISON:  September 01, 2020 FINDINGS: The heart size and mediastinal contours are within normal limits. No focal consolidation. No pleural effusion. No pneumothorax. The visualized skeletal structures are unremarkable. IMPRESSION: No active cardiopulmonary disease. Electronically Signed   By: Maudry Mayhew MD   On: 04/26/2021 00:46   CT Head Wo Contrast  Result Date: 04/25/2021 CLINICAL DATA:  Headache and lethargy EXAM: CT HEAD WITHOUT CONTRAST TECHNIQUE: Contiguous axial images were obtained from the base of the skull through the vertex without intravenous contrast. COMPARISON:  None. FINDINGS: Brain: Ventricles and sulci are normal in size and configuration. There is no appreciable intracranial mass, hemorrhage, extra-axial fluid collection, or midline shift. Brain parenchyma appears unremarkable. No evident acute infarct. Vascular: No hyperdense vessel. There is slight calcification in the carotid siphon regions. Skull: Bony calvarium appears intact. Sinuses/Orbits: There is slight mucosal thickening in several ethmoid air cells. Visualized paranasal sinuses otherwise clear. Orbits appear symmetric bilaterally. Other: Mastoid air cells are clear. IMPRESSION: Normal appearing brain parenchyma. No mass or hemorrhage. No evident acute infarct. Slight arterial vascular calcification noted. Mild mucosal thickening in several ethmoid air cells. Electronically Signed   By: Bretta Bang III M.D.   On: 04/25/2021 14:41    Procedures Procedures   Medications Ordered in ED Medications  sodium chloride 0.9 % bolus 1,000 mL (0 mLs Intravenous Stopped 04/26/21 0455)  ketorolac (TORADOL) 30 MG/ML injection 30 mg (30 mg Intravenous Given 04/26/21 0102)    ED Course  I have reviewed the triage vital signs and  the nursing notes.  Pertinent labs & imaging results that were available during  my care of the patient were reviewed by me and considered in my medical decision making (see chart for details).    MDM Rules/Calculators/A&P                          Patient presents with several days of URI/flulike symptoms.  Patient's symptoms include cough, congestion, sore throat, weakness and fatigue.  Patient's influenza and COVID swabs were negative.  He did have some atypical chest pain associated with the symptoms.  Work-up was broadened out.  Troponins are negative.  No evidence of congestive heart failure.  Chest x-ray does not show evidence of pneumonia.  Urinalysis is unremarkable.  Patient complained of headache, CT was unremarkable.  Patient seemed dehydrated at arrival.  Heart rate was slightly elevated.  This has resolved with IV fluids.  At this point, work-up points to viral etiology.  No specific treatment necessary at this time.  Will continue fever control, symptomatic treatment at home.  Addendum: We will add doxycycline for coverage of recent tick bite  Final Clinical Impression(s) / ED Diagnoses Final diagnoses:  Viral illness    Rx / DC Orders ED Discharge Orders    None       Stacie Knutzen, Canary Brimhristopher J, MD 04/26/21 [phone]

## 2021-04-25 NOTE — ED Triage Notes (Signed)
Onset 4-5 days ago developed headaches, bilateral ear pain, and sore throat. Headache 8/10 throbbing with blurry vision both eyes intermittently. Alert answering and following commands appropriate.

## 2021-04-25 NOTE — Discharge Instructions (Addendum)
-  Head straight to [facility] emergency room for further evaluation and management of the worst headache of your life associated with blurred vision.  This can be a sign of a serious health issue like an intracranial bleed, so you need urgent medical evaluation.  Make sure your sponsor drives the vehicle.  If you develop worsening of symptoms on the way, like new dizziness, shortness of breath, chest pain-stop and call 911 immediately

## 2021-04-25 NOTE — Telephone Encounter (Signed)
Patient is currently at ED/UC- note closed.

## 2021-04-25 NOTE — Telephone Encounter (Signed)
Noted Pt in Ed

## 2021-04-25 NOTE — ED Triage Notes (Signed)
Pt presents with headache, fever, ear pain, and sore throat xs 5 days.

## 2021-04-25 NOTE — ED Provider Notes (Signed)
[facility]    CSN: [id number] Arrival date & time: 04/25/21  1058      History   Chief Complaint Chief Complaint  Patient presents with  . Headache  . Otalgia  . Sore Throat    HPI Michael Farrell. is a 50 y.o. male presenting with headaches, fevers, ear pain, sore throat for 5 days.  Medical history of COPD, DVT, pulmonary embolism, schizophrenia, bipolar disorder, PTSD, hypertension.  Describes 5 days of severe headaches, with current worse headache of life and blurred vision.  Also with subjective fevers, though he has not monitored his temperature at home.  Bilateral ear pressure.  Sore throat, but denies trouble swallowing.  Has not taken any medications for his symptoms.  This patient is on chronic anticoagulation for history of PE.  Denies weakness in arms or legs. denies dizziness, chest pain, shortness of breath. Denies n/v/d, chest pain, cough, facial pain, teeth pain,loss of taste/smell, swollen lymph nodes.   HPI  Past Medical History:  Diagnosis Date  . Anxiety   . Bipolar disorder (HCC)   . COPD (chronic obstructive pulmonary disease) (HCC)   . Depression   . DVT (deep venous thrombosis) (HCC)   . Hypertension   . PTSD (post-traumatic stress disorder)   . Pulmonary embolus (HCC)   . Schizophrenia [facility])     Patient Active Problem List   Diagnosis Date Noted  . Severe episode of recurrent major depressive disorder, without psychotic features (HCC) 04/02/2021  . Moderate episode of recurrent major depressive disorder (HCC) 04/02/2021  . History of substance abuse (HCC) 04/01/2021  . Schizoaffective disorder (HCC) 04/01/2021  . Generalized anxiety disorder 02/18/2021  . Schizoaffective disorder, bipolar type (HCC) 02/18/2021  . PTSD (post-traumatic stress disorder) 02/18/2021  . Adjustment disorder with mixed anxiety and depressed mood 11/18/2020  . Substance-induced psychotic disorder (HCC)   . Methamphetamine dependence (HCC)   . Depression  05/17/2020  . MDD (major depressive disorder) 05/17/2020  . MDD (major depressive disorder), recurrent episode, severe (HCC) 05/16/2020  . 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 Moderate benzodiazepine use disorder (HCC) 05/01/2020    Past Surgical History:  Procedure Laterality Date  . CLAVICLE SURGERY     pt unsure whether it was left or right; has been broken "a couple times"  . LEG AMPUTATION         Home Medications    Prior to Admission medications   Medication Sig Start Date End Date Taking? Authorizing Provider  apixaban (ELIQUIS) 5 MG TABS tablet Take 1 tablet (5 mg total) by mouth 2 (two) times daily. 03/18/21   Hoy Register, MD  apixaban (ELIQUIS) 5 MG TABS tablet TAKE 1 TABLET (5 MG TOTAL) BY MOUTH 2 (TWO) TIMES DAILY. 02/11/21 02/11/22  Hoy Register, MD  aspirin EC 81 MG tablet Take 1 tablet (81 mg total) by mouth daily. 09/02/20   Caccavale, Sophia, PA-C  cyclobenzaprine (FLEXERIL) 10 MG tablet Take 1 tablet (10 mg total) by mouth 3 (three) times daily as needed for muscle spasms. 03/18/21   Hoy Register, MD  escitalopram (LEXAPRO) 20 MG tablet Take 1 tablet (20 mg total) by mouth daily. 04/02/21 04/02/22  Nwoko, Tommas Olp, PA  gabapentin (NEURONTIN) 400 MG capsule Take 1 capsule (400 mg total) by mouth 3 (three) times daily. 02/11/21   Hoy Register, MD  gabapentin (NEURONTIN) 400 MG capsule TAKE 1 CAPSULE (400 MG TOTAL) BY MOUTH 3 (THREE) TIMES DAILY. 02/11/21 02/11/22  Hoy Register, MD  hydrOXYzine (ATARAX/VISTARIL) 25 MG tablet Take 1 tablet (25 mg total) by mouth 3 (three) times daily as needed for anxiety. 04/02/21 04/02/22  Nwoko, Tommas Olp, PA  metoprolol tartrate (LOPRESSOR) 25 MG tablet Take 0.5 tablets (12.5 mg total) by mouth 2 (two) times daily. For high blood pressure 02/11/21   Hoy Register, MD  metoprolol tartrate (LOPRESSOR) 25 MG tablet TAKE 0.5 TABLETS (12.5 MG TOTAL) BY MOUTH 2 (TWO) TIMES DAILY.  FOR HIGH BLOOD PRESSURE 02/11/21 02/11/22  Hoy Register, MD  Misc. Devices MISC Right leg prosthesis.  Diagnosis right AKA 02/11/21   Hoy Register, MD  nicotine (NICODERM CQ) 14 mg/24hr patch Place 1 patch (14 mg total) onto the skin daily. For 1 month, then 13mcg/24 hr daily 03/18/21   Hoy Register, MD  OLANZapine (ZYPREXA) 15 MG tablet Take 1 tablet (15 mg total) by mouth at bedtime. 04/02/21   Nwoko, Tommas Olp, PA  oxyCODONE-acetaminophen (PERCOCET) 5-325 MG tablet Take 1 tablet by mouth every 6 (six) hours as needed for severe pain. Patient not taking: Reported on 03/18/2021 03/05/21   Felicie Morn, NP    Family History Family History  Family history unknown: Yes    Social History Social History   Tobacco Use  . Smoking status: Current Every Day Smoker    Packs/day: 0.50    Years: 42.00    Pack years: 21.00    Types: Cigarettes  . Smokeless tobacco: Former Neurosurgeon  . Tobacco comment: It has been hard to stop smoking  Vaping Use  . Vaping Use: Never used  Substance Use Topics  . Alcohol use: Not Currently    Comment: 2 drinks in the past 6 months  . Drug use: Not Currently    Types: Methamphetamines    Comment: Lat used 6 month ago.      Allergies   Patient has no known allergies.   Review of Systems Review of Systems  Constitutional: Positive for chills, fatigue and fever. Negative for appetite change.  HENT: Positive for congestion and sore throat. Negative for sinus pressure, trouble swallowing and voice change.   Eyes: Positive for visual disturbance. Negative for photophobia, pain, discharge, redness and itching.  Respiratory: Negative for cough, chest tightness and shortness of breath.   Cardiovascular: Negative for chest pain, palpitations and leg swelling.  Gastrointestinal: Negative for abdominal pain, constipation, diarrhea, nausea and vomiting.  Genitourinary: Negative for dysuria, flank pain, frequency and urgency.  Musculoskeletal: Positive for myalgias.  Negative for back pain, gait problem, neck pain and neck stiffness.  Neurological: Positive for headaches. Negative for dizziness, tremors, seizures, syncope, facial asymmetry, speech difficulty, weakness, light-headedness and numbness.  Psychiatric/Behavioral: Negative for agitation, decreased concentration, dysphoric mood, hallucinations and suicidal ideas. The patient is not nervous/anxious.   All other systems reviewed and are negative.    Physical Exam Triage Vital Signs ED Triage Vitals  Enc Vitals Group     BP 04/25/21 1206 119/78     Pulse Rate 04/25/21 1206 (!) 108     Resp 04/25/21 1206 18     Temp 04/25/21 1206 99.8 F (37.7 C)     Temp Source 04/25/21 1206 Oral     SpO2 04/25/21 1206 98 %     Weight --      Height --      Head Circumference --      Peak Flow --      Pain Score 04/25/21 1204 8     Pain Loc --  Pain Edu? --      Excl. in GC? --    No data found.  Updated Vital Signs BP 119/78 (BP Location: Right Arm)   Pulse (!) 108   Temp 99.8 F (37.7 C) (Oral)   Resp 18   SpO2 98%   Visual Acuity Right Eye Distance:   Left Eye Distance:   Bilateral Distance:    Right Eye Near:   Left Eye Near:    Bilateral Near:     Physical Exam Vitals reviewed.  Constitutional:      General: He is not in acute distress.    Appearance: Normal appearance. He is not ill-appearing.  HENT:     Head: Normocephalic and atraumatic.     Right Ear: Hearing, tympanic membrane, ear canal and external ear normal. No swelling or tenderness. There is no impacted cerumen. No mastoid tenderness. Tympanic membrane is not perforated, erythematous, retracted or bulging.     Left Ear: Hearing, tympanic membrane, ear canal and external ear normal. No swelling or tenderness. There is no impacted cerumen. No mastoid tenderness. Tympanic membrane is not perforated, erythematous, retracted or bulging.     Nose:     Right Sinus: No maxillary sinus tenderness or frontal sinus  tenderness.     Left Sinus: No maxillary sinus tenderness or frontal sinus tenderness.     Mouth/Throat:     Mouth: Mucous membranes are moist.     Pharynx: Uvula midline. No oropharyngeal exudate or posterior oropharyngeal erythema.     Tonsils: No tonsillar exudate.  Eyes:     Extraocular Movements: Extraocular movements intact.     Pupils: Pupils are equal, round, and reactive to light.  Cardiovascular:     Rate and Rhythm: Normal rate and regular rhythm.     Heart sounds: Normal heart sounds.  Pulmonary:     Breath sounds: Normal breath sounds and air entry. No wheezing, rhonchi or rales.  Chest:     Chest wall: No tenderness.  Abdominal:     General: Abdomen is flat. Bowel sounds are normal.     Tenderness: There is no abdominal tenderness. There is no guarding or rebound.  Lymphadenopathy:     Cervical: No cervical adenopathy.  Neurological:     General: No focal deficit present.     Mental Status: He is alert and oriented to person, place, and time.     Cranial Nerves: Cranial nerves are intact. No cranial nerve deficit.     Coordination: Coordination is intact. Romberg sign negative.     Comments: AO x1 (person only).  CN 2-12 grossly intact. Strength 4/5 in UEs and LEs.  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  Psychiatric:        Attention and Perception: Attention and perception normal.        Mood and Affect: Mood and affect normal.        Behavior: Behavior normal. Behavior is cooperative.        Thought Content: Thought content normal.        Judgment: Judgment normal.      UC Treatments / Results  Labs (all labs ordered are listed, but only abnormal results are displayed) Labs Reviewed - No data to display  EKG   Radiology No results found.  Procedures Procedures (including critical care time)  Medications Ordered in UC Medications - No data to display  Initial Impression / Assessment and Plan / UC Course  I have reviewed the  triage vital  signs and the nursing notes.  Pertinent labs & imaging results that were available during my care of the patient were reviewed by me and considered in my medical decision making (see chart for details).     This patient is a 50 year old male presenting with febrile illness, associated with worst headache of life and blurred vision.  He is borderline febrile and tachycardic, but oxygenating well on room air.  He is only alert and oriented to person, though CN 2-12 grossly intact.  Has not taken any medications for his symptoms.  This patient does have history of PE and is on anticoagulation.  Given worst headache of life and blurred vision, I am sending this patient to the emergency room for further evaluation and management, possible head CT to rule out intracranial bleed. He is hemodynamically stable for transport in personal vehicle driven by sponsor.  Final Clinical Impressions(s) / UC Diagnoses   Final diagnoses:  Viral URI with cough  Worst headache of life  Blurred vision     Discharge Instructions     -Head straight to [facility] emergency room for further evaluation and management of the worst headache of your life associated with blurred vision.  This can be a sign of a serious health issue like an intracranial bleed, so you need urgent medical evaluation.  Make sure your sponsor drives the vehicle.  If you develop worsening of symptoms on the way, like new dizziness, shortness of breath, chest pain-stop and call 911 immediately   ED Prescriptions    None     PDMP not reviewed this encounter.   Rhys Martini, PA-C 04/25/21 1333

## 2021-04-25 NOTE — ED Notes (Signed)
Patient is being discharged from the Urgent Care and sent to the Emergency Department via pov . Per Ignacia Bayley, PA, patient is in need of higher level of care due to severe headache with vision changes. Patient is aware and verbalizes understanding of plan of care.  Vitals:   04/25/21 1206  BP: 119/78  Pulse: (!) 108  Resp: 18  Temp: 99.8 F (37.7 C)  SpO2: 98%

## 2021-04-26 ENCOUNTER — Emergency Department (HOSPITAL_COMMUNITY): Payer: Medicaid Other

## 2021-04-26 ENCOUNTER — Telehealth (HOSPITAL_COMMUNITY): Payer: Self-pay | Admitting: Emergency Medicine

## 2021-04-26 LAB — TROPONIN I (HIGH SENSITIVITY)
Troponin I (High Sensitivity): 4 ng/L (ref <18)
Troponin I (High Sensitivity): 4 ng/L (ref <18)

## 2021-04-26 MED ORDER — DOXYCYCLINE HYCLATE 100 MG PO CAPS: 100.0000 mg | ORAL_CAPSULE | Freq: Two times a day (BID) | ORAL | 0 refills | Status: AC

## 2021-04-26 NOTE — Telephone Encounter (Signed)
Called patient at home and discussed the possibility of adding antibiotics for his tick bite.  He would like prescription called into [facility] on [address].  Will send prescription for doxycycline.

## 2021-04-28 ENCOUNTER — Encounter (HOSPITAL_COMMUNITY): Payer: Self-pay | Admitting: *Deleted

## 2021-04-28 ENCOUNTER — Emergency Department (HOSPITAL_COMMUNITY): Payer: Medicaid Other

## 2021-04-28 ENCOUNTER — Emergency Department (HOSPITAL_COMMUNITY)
Admission: EM | Admit: 2021-04-28 | Discharge: 2021-04-29 | Disposition: A | Discharge status: Home / Self Care | Payer: Medicaid Other | Attending: Emergency Medicine | Admitting: Emergency Medicine

## 2021-04-28 DIAGNOSIS — R451 Restlessness and agitation: Secondary | ICD-10-CM

## 2021-04-28 DIAGNOSIS — R000 Tachycardia, unspecified: Secondary | ICD-10-CM | POA: Diagnosis not present

## 2021-04-28 DIAGNOSIS — F17210 Nicotine dependence, cigarettes, uncomplicated: Secondary | ICD-10-CM | POA: Insufficient documentation

## 2021-04-28 DIAGNOSIS — Z79899 Other long term (current) drug therapy: Secondary | ICD-10-CM | POA: Insufficient documentation

## 2021-04-28 DIAGNOSIS — I10 Essential (primary) hypertension: Secondary | ICD-10-CM | POA: Insufficient documentation

## 2021-04-28 DIAGNOSIS — Z7982 Long term (current) use of aspirin: Secondary | ICD-10-CM | POA: Insufficient documentation

## 2021-04-28 DIAGNOSIS — R0789 Other chest pain: Secondary | ICD-10-CM | POA: Insufficient documentation

## 2021-04-28 DIAGNOSIS — Z20822 Contact with and (suspected) exposure to covid-19: Secondary | ICD-10-CM | POA: Insufficient documentation

## 2021-04-28 DIAGNOSIS — J449 Chronic obstructive pulmonary disease, unspecified: Secondary | ICD-10-CM | POA: Diagnosis not present

## 2021-04-28 DIAGNOSIS — Z7901 Long term (current) use of anticoagulants: Secondary | ICD-10-CM | POA: Diagnosis not present

## 2021-04-28 DIAGNOSIS — F1490 Cocaine use, unspecified, uncomplicated: Secondary | ICD-10-CM | POA: Insufficient documentation

## 2021-04-28 DIAGNOSIS — R509 Fever, unspecified: Secondary | ICD-10-CM

## 2021-04-28 LAB — CBC WITH DIFFERENTIAL/PLATELET
Abs Immature Granulocytes: 0.11 10*3/uL — ABNORMAL HIGH (ref 0.00–0.07)
Basophils Absolute: 0.1 10*3/uL (ref 0.0–0.1)
Basophils Relative: 1 %
Eosinophils Absolute: 0.2 10*3/uL (ref 0.0–0.5)
Eosinophils Relative: 1 %
HCT: 39.5 % (ref 39.0–52.0)
Hemoglobin: 13.1 g/dL (ref 13.0–17.0)
Immature Granulocytes: 1 %
Lymphocytes Relative: 18 %
Lymphs Abs: 2.7 10*3/uL (ref 0.7–4.0)
MCH: 28.7 pg (ref 26.0–34.0)
MCHC: 33.2 g/dL (ref 30.0–36.0)
MCV: 86.6 fL (ref 80.0–100.0)
Monocytes Absolute: 1.9 10*3/uL — ABNORMAL HIGH (ref 0.1–1.0)
Monocytes Relative: 13 %
Neutro Abs: 10.2 10*3/uL — ABNORMAL HIGH (ref 1.7–7.7)
Neutrophils Relative %: 66 %
Platelets: 468 10*3/uL — ABNORMAL HIGH (ref 150–400)
RBC: 4.56 MIL/uL (ref 4.22–5.81)
RDW: 13.3 % (ref 11.5–15.5)
WBC: 15.2 10*3/uL — ABNORMAL HIGH (ref 4.0–10.5)
nRBC: 0 % (ref 0.0–0.2)

## 2021-04-28 LAB — PROTIME-INR
INR: 1.2 (ref 0.8–1.2)
Prothrombin Time: 14.8 seconds (ref 11.4–15.2)

## 2021-04-28 LAB — TROPONIN I (HIGH SENSITIVITY)
Troponin I (High Sensitivity): 6 ng/L (ref <18)
Troponin I (High Sensitivity): 4 ng/L (ref <18)

## 2021-04-28 LAB — BASIC METABOLIC PANEL
Anion gap: 11 (ref 5–15)
BUN: 12 mg/dL (ref 6–20)
CO2: 25 mmol/L (ref 22–32)
Calcium: 9 mg/dL (ref 8.9–10.3)
Chloride: 98 mmol/L (ref 98–111)
Creatinine, Ser: 0.87 mg/dL (ref 0.61–1.24)
GFR, Estimated: >60 mL/min (ref >60)
Glucose, Bld: 81 mg/dL (ref 70–99)
Potassium: 3.7 mmol/L (ref 3.5–5.1)
Sodium: 134 mmol/L — ABNORMAL LOW (ref 135–145)

## 2021-04-28 MED ORDER — LORAZEPAM 2 MG/ML IJ SOLN
0.5000 mg | Freq: Once | INTRAMUSCULAR | Status: AC
  Administered 2021-04-29: 0.5 mg via INTRAVENOUS
  Filled 2021-04-28: qty 1

## 2021-04-28 MED ORDER — SODIUM CHLORIDE 0.9 % IV BOLUS
1000.0000 mL | Freq: Once | INTRAVENOUS | Status: AC
  Administered 2021-04-29: 1000 mL via INTRAVENOUS

## 2021-04-28 NOTE — ED Triage Notes (Signed)
Pt arrived by [facility] for chest pain. Was seen on Saturday for soon. Reports pain radiates into his neck.

## 2021-04-28 NOTE — ED Provider Notes (Signed)
Emergency Medicine Provider Triage Evaluation Note  Michael Farrell , a 50 y.o. male  was evaluated in triage.  Pt complains of chest pain x since discharge from hospital.  Was diagnosed previously with a viral illness when last seen.  Currently on Eliquis twice daily.  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  Appropriate orders placed.  Michael Farrell. was informed that the remainder of the evaluation will be completed by another provider, this initial triage assessment does not replace that evaluation, and the importance of remaining in the ED until their evaluation is complete.  Patient here with body aches, chest pain brought in via EMS.  Diagnosed with viral illness on 513.  Prior history of DVT, PE, currently on Eliquis and reports compliance with the 5 mg twice daily.  Rate slightly up at 109, temperature is 99.9.  Chest pain protocol ordered.   Claude Manges, PA-C 04/28/21 1611    Wynetta Fines, MD 04/29/21 [phone]

## 2021-04-28 NOTE — ED Provider Notes (Signed)
[facility] EMERGENCY DEPARTMENT Provider Note   CSN: [id number] Arrival date & time: 04/28/21  1458     History Chief Complaint  Patient presents with  . Chest Pain    Michael Farrell. is a 50 y.o. male.  50 year old male with prior medical history as detailed below presents for evaluation. Patient is complaining of continued chest pain and rapid heart rate.  Patient reports recent visit where he was told that he had a possible viral infection.  He was also prescribed doxycycline for recent tick bite.  He is not taking the doxycycline.  He reports that he has not been able to swallow it and he throws it back up.  He denies current chest pain during my evaluation.  He denies recent amphetamine use or abuse.  He denies other recent use of illicit drugs.  He reports that his chest pain is worse when his heart rate is fast.  He reports that his heart rate has been fast since his last ED visit.  The history is provided by the patient and medical records.  Chest Pain Chest pain location: vague anterior chest discomfort. Pain quality: aching   Pain radiates to:  Neck Pain severity:  No pain Onset quality:  Unable to specify Duration:  3 days Timing:  Unable to specify Progression:  Unable to specify      Past Medical History:  Diagnosis Date  . Anxiety   . Bipolar disorder (HCC)   . COPD (chronic obstructive pulmonary disease) (HCC)   . Depression   . DVT (deep venous thrombosis) (HCC)   . Hypertension   . PTSD (post-traumatic stress disorder)   . Pulmonary embolus (HCC)   . Schizophrenia [facility])     Patient Active Problem List   Diagnosis Date Noted  . Severe episode of recurrent major depressive disorder, without psychotic features (HCC) 04/02/2021  . Moderate episode of recurrent major depressive disorder (HCC) 04/02/2021  . History of substance abuse (HCC) 04/01/2021  . Schizoaffective disorder (HCC) 04/01/2021  . Generalized anxiety disorder  02/18/2021  . Schizoaffective disorder, bipolar type (HCC) 02/18/2021  . PTSD (post-traumatic stress disorder) 02/18/2021  . Adjustment disorder with mixed anxiety and depressed mood 11/18/2020  . Substance-induced psychotic disorder (HCC)   . Methamphetamine dependence (HCC)   . Depression 05/17/2020  . MDD (major depressive disorder) 05/17/2020  . MDD (major depressive disorder), recurrent episode, severe (HCC) 05/16/2020  . 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 Moderate benzodiazepine use disorder (HCC) 05/01/2020    Past Surgical History:  Procedure Laterality Date  . CLAVICLE SURGERY     pt unsure whether it was left or right; has been broken "a couple times"  . LEG AMPUTATION         Family History  Family history unknown: Yes    Social History   Tobacco Use  . Smoking status: Current Every Day Smoker    Packs/day: 0.50    Years: 42.00    Pack years: 21.00    Types: Cigarettes  . Smokeless tobacco: Former Neurosurgeon  . Tobacco comment: It has been hard to stop smoking  Vaping Use  . Vaping Use: Never used  Substance Use Topics  . Alcohol use: Not Currently    Comment: 2 drinks in the past 6 months  . Drug use: Not Currently    Types: Methamphetamines    Comment: Lat used 6 month ago.     Home Medications Prior  to Admission medications   Medication Sig Start Date End Date Taking? Authorizing Provider  acetaminophen (TYLENOL) 500 MG tablet Take 1,000 mg by mouth every 6 (six) hours as needed for moderate pain or headache.    [provider]  apixaban (ELIQUIS) 5 MG TABS tablet Take 1 tablet (5 mg total) by mouth 2 (two) times daily. 03/18/21   Hoy Register, MD  aspirin EC 81 MG tablet Take 1 tablet (81 mg total) by mouth daily. 09/02/20   Caccavale, Sophia, PA-C  cyclobenzaprine (FLEXERIL) 10 MG tablet Take 1 tablet (10 mg total) by mouth 3 (three) times daily as needed for muscle spasms. 03/18/21    Hoy Register, MD  doxycycline (VIBRAMYCIN) 100 MG capsule Take 1 capsule (100 mg total) by mouth 2 (two) times daily. 04/26/21   Gilda Crease, MD  escitalopram (LEXAPRO) 20 MG tablet Take 1 tablet (20 mg total) by mouth daily. 04/02/21 04/02/22  Nwoko, Tommas Olp, PA  gabapentin (NEURONTIN) 400 MG capsule Take 1 capsule (400 mg total) by mouth 3 (three) times daily. 02/11/21   Hoy Register, MD  gabapentin (NEURONTIN) 400 MG capsule TAKE 1 CAPSULE (400 MG TOTAL) BY MOUTH 3 (THREE) TIMES DAILY. Patient taking differently: Take 400 mg by mouth 3 (three) times daily. 02/11/21 02/11/22  Hoy Register, MD  hydrOXYzine (ATARAX/VISTARIL) 25 MG tablet Take 1 tablet (25 mg total) by mouth 3 (three) times daily as needed for anxiety. 04/02/21 04/02/22  Nwoko, Tommas Olp, PA  metoprolol tartrate (LOPRESSOR) 25 MG tablet Take 0.5 tablets (12.5 mg total) by mouth 2 (two) times daily. For high blood pressure 02/11/21   Hoy Register, MD  metoprolol tartrate (LOPRESSOR) 25 MG tablet TAKE 0.5 TABLETS (12.5 MG TOTAL) BY MOUTH 2 (TWO) TIMES DAILY. FOR HIGH BLOOD PRESSURE Patient taking differently: Take 12.5 mg by mouth 2 (two) times daily. 02/11/21 02/11/22  Hoy Register, MD  Misc. Devices MISC Right leg prosthesis.  Diagnosis right AKA 02/11/21   Hoy Register, MD  nicotine (NICODERM CQ) 14 mg/24hr patch Place 1 patch (14 mg total) onto the skin daily. For 1 month, then 10mcg/24 hr daily 03/18/21   Hoy Register, MD  OLANZapine (ZYPREXA) 15 MG tablet Take 1 tablet (15 mg total) by mouth at bedtime. 04/02/21   Nwoko, Tommas Olp, PA  oxyCODONE-acetaminophen (PERCOCET) 5-325 MG tablet Take 1 tablet by mouth every 6 (six) hours as needed for severe pain. Patient not taking: No sig reported 03/05/21   Felicie Morn, NP    Allergies    Patient has no known allergies.  Review of Systems   Review of Systems  Cardiovascular: Positive for chest pain.  All other systems reviewed and are negative.   Physical  Exam Updated Vital Signs BP 135/81   Pulse (!) 117   Temp 99.3 F (37.4 C) (Oral)   Resp 19   SpO2 100%   Physical Exam Vitals and nursing note reviewed.  Constitutional:      General: He is not in acute distress.    Appearance: He is well-developed.     Comments: Patient is constantly moving.  He is unable to remain still.  He is rubbing his right leg stump at site of amputation.  HENT:     Head: Normocephalic and atraumatic.  Eyes:     Conjunctiva/sclera: Conjunctivae normal.     Pupils: Pupils are equal, round, and reactive to light.  Cardiovascular:     Rate and Rhythm: Normal rate and regular rhythm.  Heart sounds: Normal heart sounds.  Pulmonary:     Effort: Pulmonary effort is normal. No respiratory distress.     Breath sounds: Normal breath sounds.  Abdominal:     General: There is no distension.     Palpations: Abdomen is soft.     Tenderness: There is no abdominal tenderness.  Musculoskeletal:        General: No deformity. Normal range of motion.     Cervical back: Normal range of motion and neck supple.     Comments: Status post right above-the-knee amputation  Skin:    General: Skin is warm and dry.  Neurological:     Mental Status: He is alert and oriented to person, place, and time.     ED Results / Procedures / Treatments   Labs (all labs ordered are listed, but only abnormal results are displayed) Labs Reviewed  CBC WITH DIFFERENTIAL/PLATELET - Abnormal; Notable for the following components:      Result Value   WBC 15.2 (*)    Platelets 468 (*)    Neutro Abs 10.2 (*)    Monocytes Absolute 1.9 (*)    Abs Immature Granulocytes 0.11 (*)    All other components within normal limits  BASIC METABOLIC PANEL - Abnormal; Notable for the following components:   Sodium 134 (*)    All other components within normal limits  PROTIME-INR  RAPID URINE DRUG SCREEN, HOSP PERFORMED  LACTIC ACID, PLASMA  LACTIC ACID, PLASMA  CK  TROPONIN I (HIGH  SENSITIVITY)  TROPONIN I (HIGH SENSITIVITY)    EKG EKG Interpretation  Date/Time:  Monday Apr 28 2021 15:54:01 EDT Ventricular Rate:  111 PR Interval:  140 QRS Duration: 94 QT Interval:  344 QTC Calculation: 467 R Axis:   80 Text Interpretation: Sinus tachycardia Incomplete right bundle branch block Borderline ECG Confirmed by Kristine Royal [phone]) on 04/28/2021 9:53:54 PM   Radiology DG Chest 2 View  Result Date: 04/28/2021 CLINICAL DATA:  Chest pain. EXAM: CHEST - 2 VIEW COMPARISON:  Apr 26, 2021. FINDINGS: The heart size and mediastinal contours are within normal limits. Both lungs are clear. The visualized skeletal structures are unremarkable. IMPRESSION: No active cardiopulmonary disease. Electronically Signed   By: Lupita Raider M.D.   On: 04/28/2021 16:57    Procedures Procedures   Medications Ordered in ED Medications  sodium chloride 0.9 % bolus 1,000 mL (has no administration in time range)  LORazepam (ATIVAN) injection 0.5 mg (has no administration in time range)    ED Course  I have reviewed the triage vital signs and the nursing notes.  Pertinent labs & imaging results that were available during my care of the patient were reviewed by me and considered in my medical decision making (see chart for details).    MDM Rules/Calculators/A&P                          MDM  MSE complete  Amauris Debois. was evaluated in Emergency Department on 04/28/2021 for the symptoms described in the history of present illness. He was evaluated in the context of the global COVID-19 pandemic, which necessitated consideration that the patient might be at risk for infection with the SARS-CoV-2 virus that causes COVID-19. Institutional protocols and algorithms that pertain to the evaluation of patients at risk for COVID-19 are in a state of rapid change based on information released by regulatory bodies including the CDC and federal and state organizations. These  policies and  algorithms were followed during the patient's care in the ED.   Patient is complaining of atypical chest discomfort, fast heart rate, and myalgia.  Symptoms appear to have been ongoing since last ED evaluation on the 13th.  Patient was not very forthcoming with additional history.  Screening labs obtained.  Troponin is without elevation or significant delta.  However patient's white count is 15 today up from 9.83 days prior..    Patient is also persistently tachycardic..  Will administer small dose of Ativan and give IV fluids.  Patient is advised to give us a urine for evaluation.  I am suspicious of possible methamphetamine use or abuse.  Pending labs and disposition discussed with Dr. Blinda LeatherwoodPollina.    Final Clinical Impression(s) / ED Diagnoses Final diagnoses:  Tachycardia    Rx / DC Orders ED Discharge Orders    None       Wynetta FinesMessick, Gregrey Bloyd C, MD 04/28/21 2356

## 2021-04-29 LAB — CK: Total CK: 178 U/L (ref 49–397)

## 2021-04-29 LAB — RAPID URINE DRUG SCREEN, HOSP PERFORMED
Amphetamines: POSITIVE — AB
Barbiturates: NOT DETECTED
Benzodiazepines: NOT DETECTED
Cocaine: POSITIVE — AB
Opiates: NOT DETECTED
Tetrahydrocannabinol: NOT DETECTED

## 2021-04-29 LAB — RESP PANEL BY RT-PCR (FLU A&B, COVID) ARPGX2
Influenza A by PCR: NEGATIVE
Influenza B by PCR: NEGATIVE
SARS Coronavirus 2 by RT PCR: NEGATIVE

## 2021-04-29 LAB — LACTIC ACID, PLASMA: Lactic Acid, Venous: 1.1 mmol/L (ref 0.5–1.9)

## 2021-04-29 NOTE — ED Provider Notes (Signed)
Patient signed out to me by Dr. Rodena Medin.  He was originally seen with chest pain.  Patient had a reassuring cardiac evaluation.  Patient noted to be persistently tachycardic.  He has positive for cocaine and it was thought that this might be the reason.  He has not improved with IV fluids and Ativan.  Upon recheck he is agitated, rocking and rolling in the bed.  He seems distracted but when spoken to can answer questions appropriately.  This is a change from when I saw him the other night.  At that time he was experiencing fevers and had had a tick bite, was prescribed doxycycline.  He appeared well on discharge.  It seems that he has not been compliant with the doxycycline.  Had a lengthy conversation with the patient and his significant other.  With his tachycardia and severe agitation with persistent fevers, would benefit from hospitalization.  I did recommend lumbar puncture to rule out encephalitis and meningitis.  This was discussed at length with the patient and his significant other.  They understand the recommendations but he does not wish to be admitted or proceed.  Although he is agitated, he is not altered and can clearly understand the conversation.  He and his significant other were counseled that he should come back to the ER immediately if he has any worsening problems.   Gilda Crease, MD 04/29/21 [phone]

## 2021-04-29 NOTE — ED Notes (Signed)
Pt moving around a lot in bed. Unable to stay still. He keeps moving his R leg, which is an above the knee amputee, up and down and rubbing it. Pt has hx of anxiety.

## 2021-04-29 NOTE — Discharge Instructions (Addendum)
I am concerned that you are very sick.  I am not sure what is causing your fever.  Please take the doxycycline twice a day.  As we discussed, I feel like you should have been admitted today.  Please come back to the ER immediately if you have any concerns or worsening symptoms.

## 2021-05-14 DEATH — deceased

## 2021-06-05 ENCOUNTER — Encounter (HOSPITAL_COMMUNITY): Payer: Medicaid Other | Admitting: Physician Assistant

## 2022-07-01 IMAGING — CT CT HEAD W/O CM
4 series · 16 of 47 positions shown, 18 images · non-contrast
Comparison: None.

CLINICAL DATA: Headache and lethargy

EXAM:
CT HEAD WITHOUT CONTRAST
TECHNIQUE: Contiguous axial images were obtained from the base of the skull
through the vertex without intravenous contrast.

[Series 3: head without · axial · non-contrast · 0.43mm/px · z∈[-64,+56]mm · 7 of 33 slices shown, 9 images]
[im 5/33  brain]
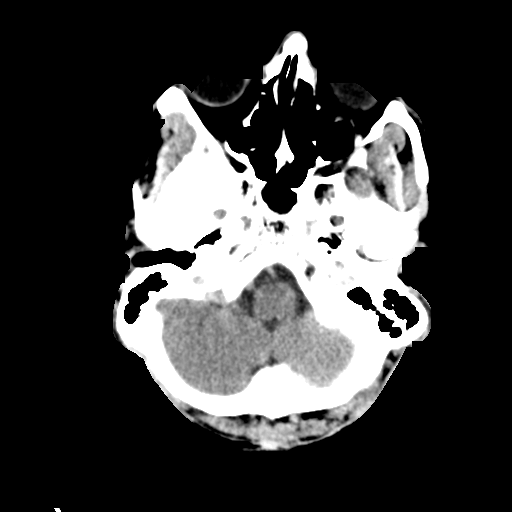
[im 5/33  bone]
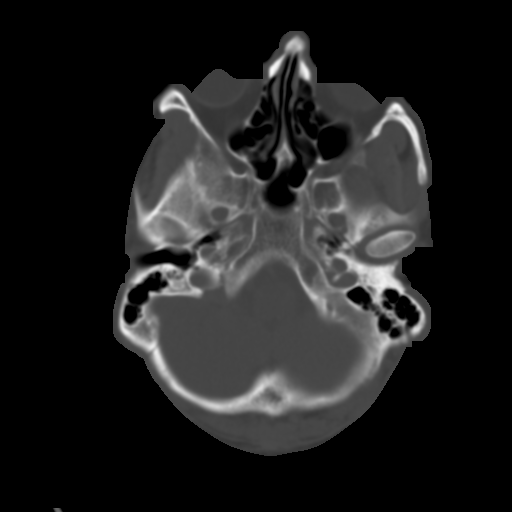
[im 9/33  brain]
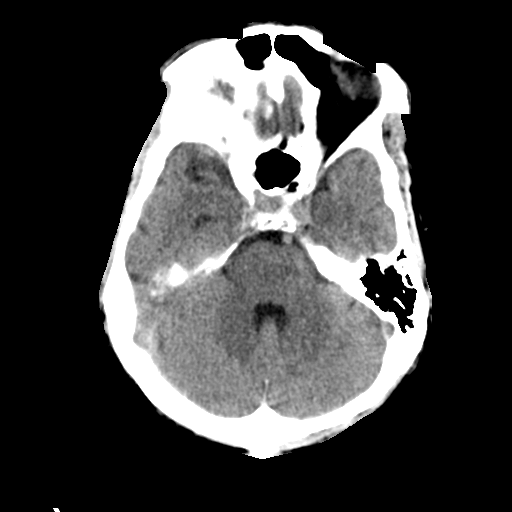
[im 13/33  brain]
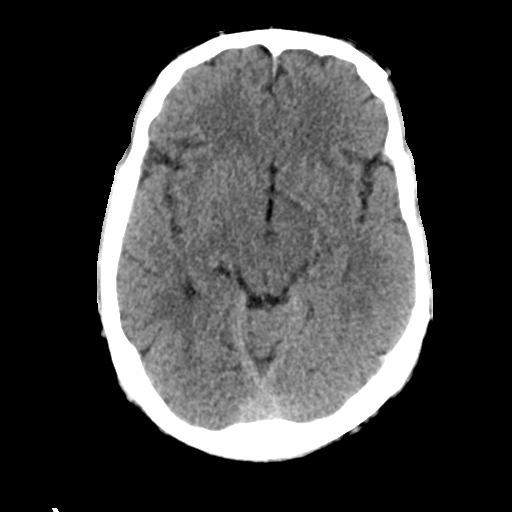
[im 17/33  brain]
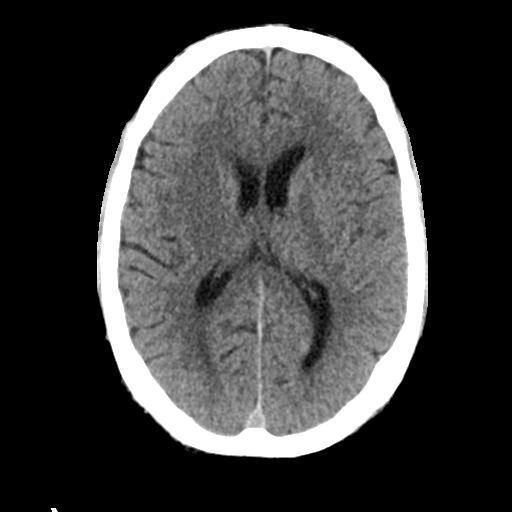
[im 21/33  brain]
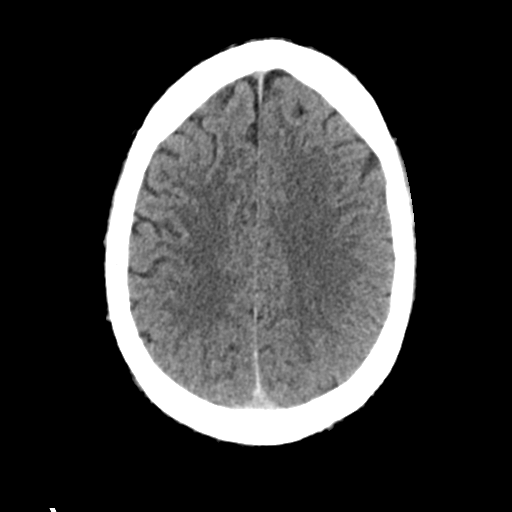
[im 21/33  bone]
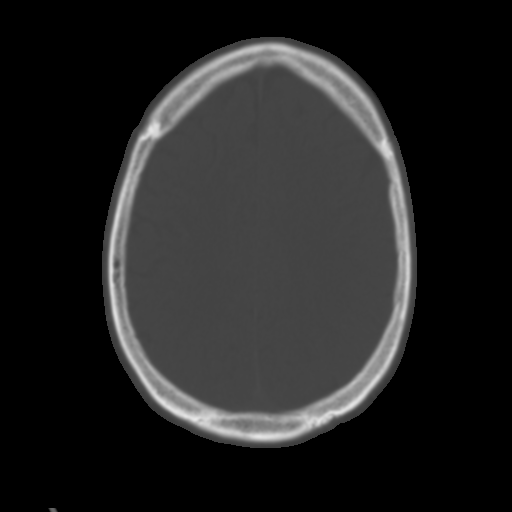
[im 25/33  brain]
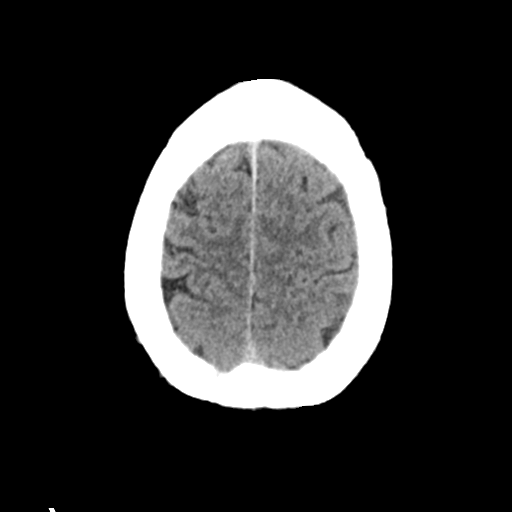
[im 29/33  brain]
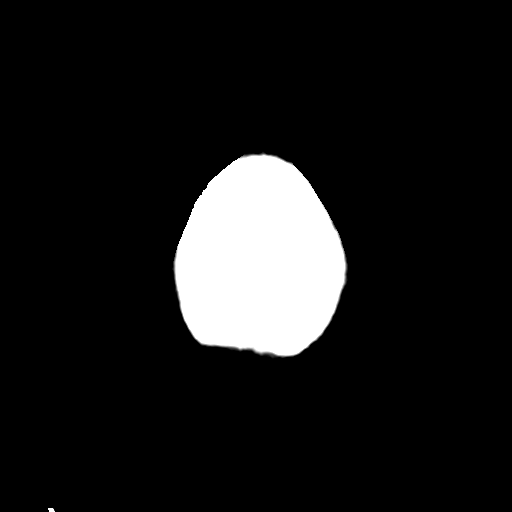

[Series 4: head bone · axial · 0.43mm/px · z∈[-68,-36]mm · 3 of 81 slices shown]
[im 9/81  bone]
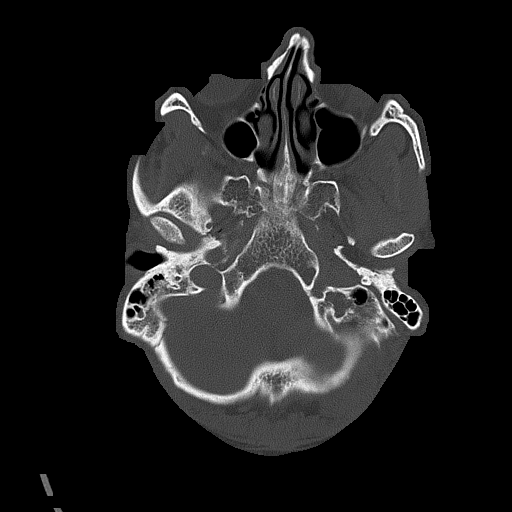
[im 17/81  bone]
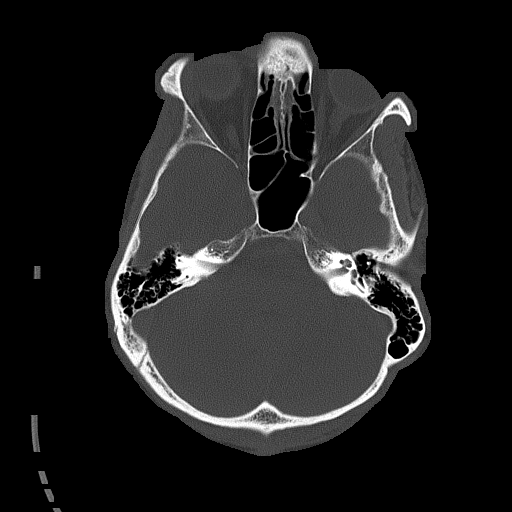
[im 25/81  bone]
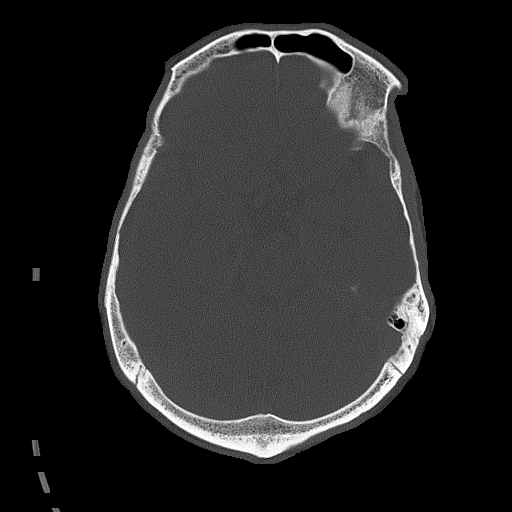

[Series 5: head without cor · coronal · non-contrast · 0.31mm/px · 3 of 68 slices shown]
[im 23/68  brain]
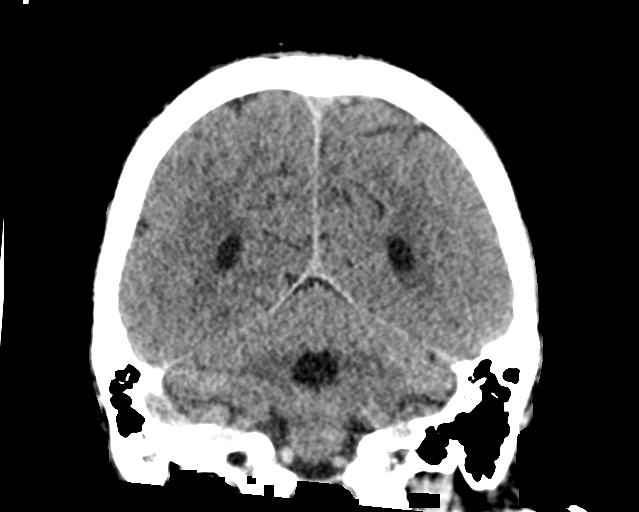
[im 30/68  brain]
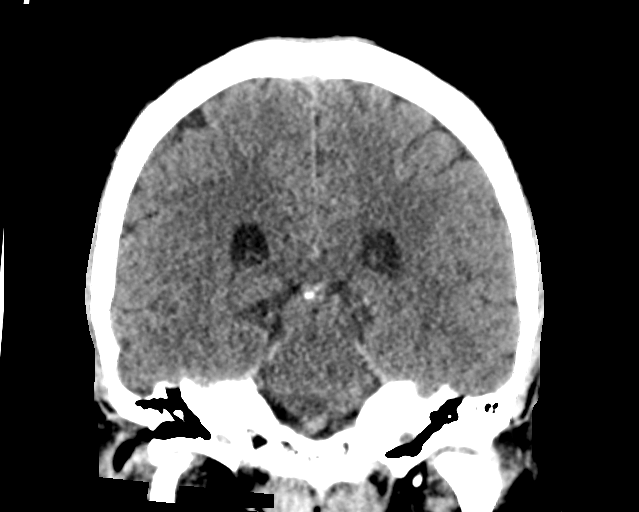
[im 38/68  brain]
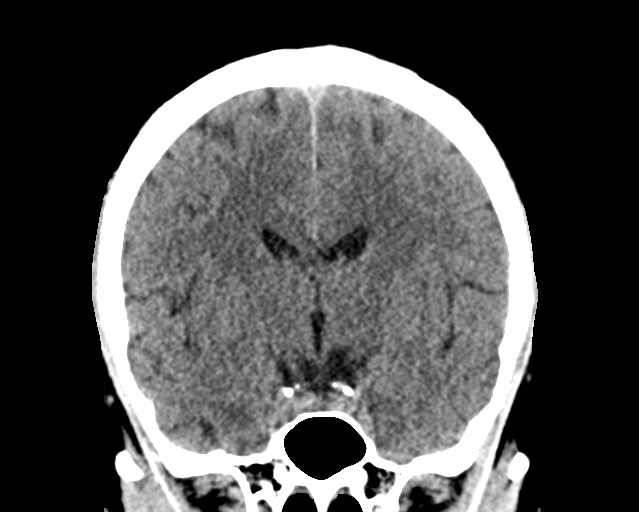

[Series 6: head without sag · sagittal · non-contrast · 0.31mm/px · 3 of 60 slices shown]
[im 21/60  brain]
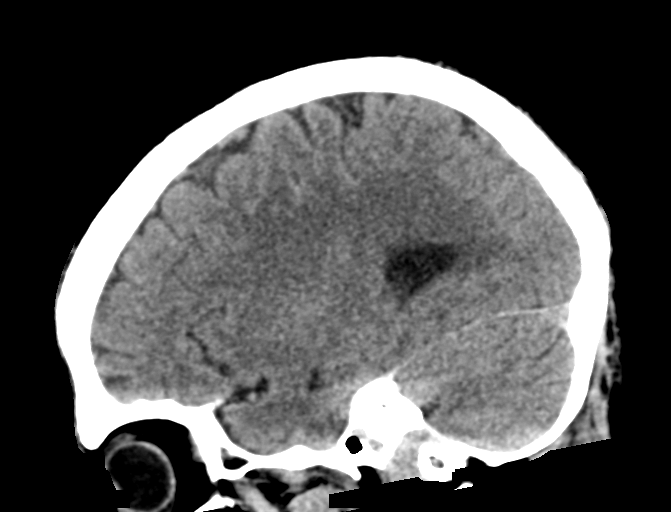
[im 30/60  brain]
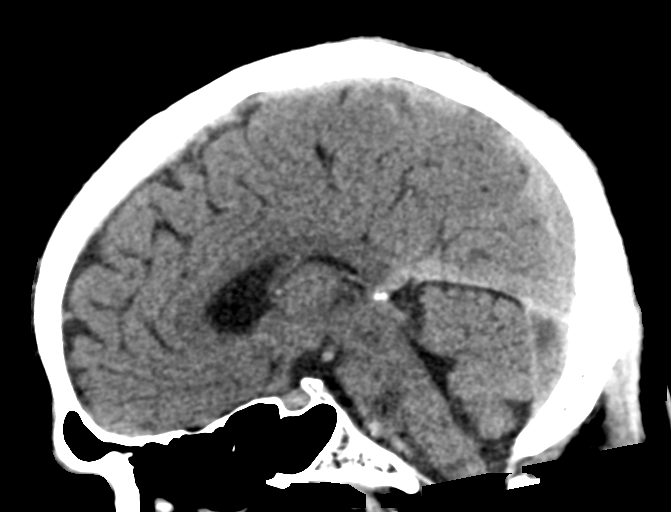
[im 40/60  brain]
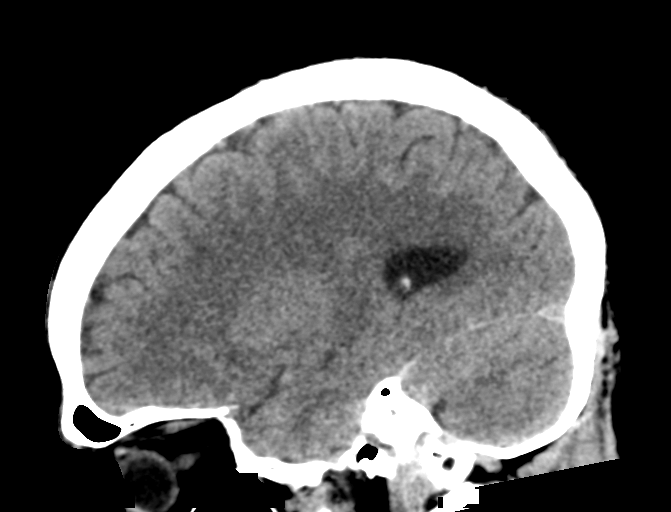

[16 of 47 positions shown; findings below may reference images not displayed]

FINDINGS: Brain: Ventricles and sulci are normal in size and configuration.
There is no appreciable intracranial mass, hemorrhage, extra-axial
fluid collection, or midline shift. Brain parenchyma appears
unremarkable. No evident acute infarct.

Vascular: No hyperdense vessel. There is slight calcification in the
carotid siphon regions.

Skull: Bony calvarium appears intact.

Sinuses/Orbits: There is slight mucosal thickening in several
ethmoid air cells. Visualized paranasal sinuses otherwise clear.
Orbits appear symmetric bilaterally.

Other: Mastoid air cells are clear.
IMPRESSION: Normal appearing brain parenchyma. No mass or hemorrhage. No evident
acute infarct.

Slight arterial vascular calcification noted. Mild mucosal
thickening in several ethmoid air cells.
